# Patient Record
Sex: Male | Born: 1955 | Race: White | Hispanic: No | Marital: Married | Smoking: Current every day smoker
Health system: Southern US, Community
[De-identification: ages and names within clinical notes are randomized; demographics above are authoritative.]

## PROBLEM LIST (undated history)

## (undated) DIAGNOSIS — E785 Hyperlipidemia, unspecified: Secondary | ICD-10-CM

## (undated) DIAGNOSIS — I10 Essential (primary) hypertension: Secondary | ICD-10-CM

## (undated) HISTORY — DX: Essential (primary) hypertension: I10

## (undated) HISTORY — DX: Hyperlipidemia, unspecified: E78.5

---

## 2000-09-07 ENCOUNTER — Emergency Department (HOSPITAL_COMMUNITY): Admission: EM | Admit: 2000-09-07 | Discharge: 2000-09-07 | Payer: Self-pay | Admitting: Emergency Medicine

## 2000-09-07 ENCOUNTER — Encounter: Payer: Self-pay | Admitting: Emergency Medicine

## 2002-10-11 ENCOUNTER — Encounter: Payer: Self-pay | Admitting: Critical Care Medicine

## 2002-10-11 ENCOUNTER — Ambulatory Visit (HOSPITAL_COMMUNITY): Admission: RE | Admit: 2002-10-11 | Discharge: 2002-10-11 | Payer: Self-pay | Admitting: Critical Care Medicine

## 2002-10-22 ENCOUNTER — Encounter: Payer: Self-pay | Admitting: Critical Care Medicine

## 2002-10-22 ENCOUNTER — Ambulatory Visit (HOSPITAL_COMMUNITY): Admission: RE | Admit: 2002-10-22 | Discharge: 2002-10-22 | Payer: Self-pay | Admitting: Critical Care Medicine

## 2003-03-03 ENCOUNTER — Inpatient Hospital Stay (HOSPITAL_COMMUNITY): Admission: EM | Admit: 2003-03-03 | Discharge: 2003-03-05 | Payer: Self-pay | Admitting: Emergency Medicine

## 2011-09-16 DIAGNOSIS — M722 Plantar fascial fibromatosis: Secondary | ICD-10-CM | POA: Insufficient documentation

## 2012-08-03 DIAGNOSIS — R519 Headache, unspecified: Secondary | ICD-10-CM | POA: Insufficient documentation

## 2013-12-06 DIAGNOSIS — L209 Atopic dermatitis, unspecified: Secondary | ICD-10-CM | POA: Insufficient documentation

## 2013-12-06 DIAGNOSIS — L409 Psoriasis, unspecified: Secondary | ICD-10-CM | POA: Insufficient documentation

## 2014-06-25 DIAGNOSIS — R7309 Other abnormal glucose: Secondary | ICD-10-CM | POA: Insufficient documentation

## 2016-07-19 DIAGNOSIS — Z720 Tobacco use: Secondary | ICD-10-CM | POA: Insufficient documentation

## 2017-02-08 DIAGNOSIS — R0609 Other forms of dyspnea: Secondary | ICD-10-CM | POA: Insufficient documentation

## 2017-05-24 ENCOUNTER — Ambulatory Visit: Payer: Self-pay | Admitting: Osteopathic Medicine

## 2017-05-30 ENCOUNTER — Encounter: Payer: Self-pay | Admitting: Osteopathic Medicine

## 2017-05-30 ENCOUNTER — Ambulatory Visit: Payer: Managed Care, Other (non HMO) | Admitting: Osteopathic Medicine

## 2017-05-30 VITALS — BP 161/94 | HR 76 | Temp 98.1°F | Wt 212.0 lb

## 2017-05-30 DIAGNOSIS — Z720 Tobacco use: Secondary | ICD-10-CM | POA: Diagnosis not present

## 2017-05-30 DIAGNOSIS — I10 Essential (primary) hypertension: Secondary | ICD-10-CM | POA: Diagnosis not present

## 2017-05-30 DIAGNOSIS — Z23 Encounter for immunization: Secondary | ICD-10-CM | POA: Diagnosis not present

## 2017-05-30 DIAGNOSIS — E349 Endocrine disorder, unspecified: Secondary | ICD-10-CM | POA: Insufficient documentation

## 2017-05-30 MED ORDER — AMLODIPINE BESYLATE 10 MG PO TABS: 10.0000 mg | ORAL_TABLET | Freq: Every day | ORAL | 3 refills | Status: DC

## 2017-05-30 MED ORDER — HYDROCHLOROTHIAZIDE 25 MG PO TABS: 25.0000 mg | ORAL_TABLET | Freq: Every day | ORAL | 3 refills | Status: DC

## 2017-05-30 NOTE — Patient Instructions (Addendum)
   Will send refills of your medications today  Will plan to get labs ahead of next visit in 6 months - please call our office to make sure orders are in place   Any questions, let me know!    Plan to return for nurse visit to verify home blood pressure cuff. In the meantime, be keeping a record of you rblood pressures at home and bring this with you to the visit with the nurse.  If your cuff is measuring within 5-10 points of ours AND your home numbers are less than 140/90 (ideally less than 130/80) then nothing else to do.  If your home blood pressure cuff is inaccurate or is accurate but measuring above goal, we will need to talk about adjusting your medications.

## 2017-05-30 NOTE — Progress Notes (Signed)
HPI: James Alexander is a 62 y.o. male who  has a past medical history of High blood pressure.  he presents to [facility] today, 05/30/17,  for chief complaint of: Establish care Requests labs  HTN  New patient to est. Care, his daughter sees me. He works as a Administrator. Minimal health problems other than hypertension.  Pt has HTN and (+)FH MI in father. Reports his last Dr ordered an EKG which was suspicious for previous MI, sent for stress test d/t abn EKG 12/2016, the cardiologists told his at that point that he never had a heart attack - understandably, James Alexander was upset about the communication issues/confusion over whether or not he actually ever had heart attack. 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 Normal NM perfusion study by cardiology 02/2017. He has no issues with chest pain, trouble breathing, headache or vision change.  Labs reviewed: 12/2016 BMP ok, CBC was ok.  HepC screening engative and A1C was fine Lipids no concerns, last checked 07/2016 No elevated PSA's 2 low T values 2011      Past medical, surgical, social and family history reviewed:  Patient Active Problem List   Diagnosis Date Noted  . Hypertension 05/30/2017  . Hypotestosteronism 05/30/2017  . Tobacco use 07/19/2016    History reviewed. No pertinent surgical history.  Social History   Tobacco Use  . Smoking status: Current Every Day Smoker  . Smokeless tobacco: Never Used  . Tobacco comment: 30+ pack years, has cut back to 1 ppd  Substance Use Topics  . Alcohol use: Yes    Family History  Problem Relation Age of Onset  . Liver cancer Mother   . Heart attack Father   . High blood pressure Father      Current medication list and allergy/intolerance information reviewed:    Current Outpatient Medications  Medication Sig Dispense Refill  . amLODipine (NORVASC) 10 MG tablet  Take 1 tablet (10 mg total) by mouth daily. 90 tablet 3  . hydrochlorothiazide (HYDRODIURIL) 25 MG tablet Take 1 tablet (25 mg total) by mouth daily. 90 tablet 3   No current facility-administered medications for this visit.     Allergies  Allergen Reactions  . Penicillins Shortness Of Breath and Swelling      Review of Systems:  Constitutional:  No  fever, no chills, No recent illness, No unintentional weight changes. No significant fatigue.   HEENT: No  headache, no vision change, no hearing change, No sore throat, No  sinus pressure  Cardiac: No  chest pain, No  pressure, No palpitations, No  Orthopnea  Respiratory:  No  shortness of breath. No  Cough  Gastrointestinal: No  abdominal pain, No  nausea, No  vomiting,  No  blood in stool, No  diarrhea, No  constipation   Musculoskeletal: No new myalgia/arthralgia  Skin: No  Rash, No other wounds/concerning lesions  Genitourinary: No  incontinence, No  abnormal genital bleeding, No abnormal genital discharge  Hem/Onc: No  easy bruising/bleeding, No  abnormal lymph node  Endocrine: No cold intolerance,  No heat intolerance. No polyuria/polydipsia/polyphagia   Neurologic: No  weakness, No  dizziness, No  slurred speech/focal weakness/facial droop  Psychiatric: No  concerns with depression, No  concerns with anxiety, No sleep problems, No mood problems  Exam:  BP (!) 161/94   Pulse 76   Temp 98.1 F (36.7 C) (Oral)   Wt 212  lb 0.6 oz (96.2 kg)   Constitutional: VS see above. General Appearance: alert, well-developed, well-nourished, NAD  Eyes: Normal lids and conjunctive, non-icteric sclera  Ears, Nose, Mouth, Throat: MMM, Normal external inspection ears/nares/mouth/lips/gums. TM normal bilaterally. Pharynx/tonsils no erythema, no exudate. Nasal mucosa normal.   Neck: No masses, trachea midline. No thyroid enlargement. No tenderness/mass appreciated. No lymphadenopathy  Respiratory: Normal respiratory effort. no  wheeze, no rhonchi, no rales  Cardiovascular: S1/S2 normal, no murmur, no rub/gallop auscultated. RRR. No lower extremity edema. Pedal pulse II/IV bilaterally DP and PT. No carotid bruit or JVD. No abdominal aortic bruit.  Gastrointestinal: Nontender, no masses. No hepatomegaly, no splenomegaly. No hernia appreciated. Bowel sounds normal. Rectal exam deferred.   Musculoskeletal: Gait normal. No clubbing/cyanosis of digits.   Neurological: Normal balance/coordination. No tremor. No cranial nerve deficit on limited exam. Motor and sensation intact and symmetric. Cerebellar reflexes intact.   Skin: warm, dry, intact. No rash/ulcer. No concerning nevi or subq nodules on limited exam.    Psychiatric: Normal judgment/insight. Normal mood and affect. Oriented x3.      ASSESSMENT/PLAN:   Essential hypertension - Not at goal, patient states he would like to measure this at home, we need to verify home BP cuff. See patient instructions  Need for influenza vaccination - Plan: Flu Vaccine QUAD 6+ mos PF IM (Fluarix Quad PF)  Tobacco use    Patient Instructions   Will send refills of your medications today  Will plan to get labs ahead of next visit in 6 months - please call our office to make sure orders are in place   Any questions, let me know!    Plan to return for nurse visit to verify home blood pressure cuff. In the meantime, be keeping a record of you rblood pressures at home and bring this with you to the visit with the nurse.  If your cuff is measuring within 5-10 points of ours AND your home numbers are less than 140/90 (ideally less than 130/80) then nothing else to do.  If your home blood pressure cuff is inaccurate or is accurate but measuring above goal, we will need to talk about adjusting your medications.      Visit summary with medication list and pertinent instructions was printed for patient to review. All questions at time of visit were answered - patient instructed  to contact office with any additional concerns. ER/RTC precautions were reviewed with the patient.   Follow-up plan: Return in about 6 months (around 11/27/2017) for [facility] PHYSICAL and labs, blood pressure recheck . See me sooner if needed! .  Note: Total time spent 30 minutes, greater than 50% of the visit was spent face-to-face counseling and coordinating care for the following: The primary encounter diagnosis was Essential hypertension. Diagnoses of Need for influenza vaccination and Tobacco use were also pertinent to this visit.[name] Kitchen  Please note: voice recognition software was used to produce this document, and typos may escape review. Please contact Dr. Sheppard Coil for any needed clarifications.

## 2017-05-31 ENCOUNTER — Encounter: Payer: Self-pay | Admitting: Osteopathic Medicine

## 2017-09-14 ENCOUNTER — Telehealth: Payer: Self-pay

## 2017-09-14 DIAGNOSIS — Z0000 Encounter for general adult medical examination without abnormal findings: Secondary | ICD-10-CM

## 2017-09-14 NOTE — Telephone Encounter (Signed)
Pt has a physical scheduled for 11-30-17. Pt wanted to request that his labs be ordered so that he can come in a couple days prior to have them drawn.   Msg to Dr Sheppard Coil.   Pt states he does not need call back unless there is an issue ordering labs.   Thanks!

## 2017-09-18 NOTE — Telephone Encounter (Signed)
Noted  

## 2017-09-18 NOTE — Telephone Encounter (Signed)
Orders in, he should be able to come fasting to the lab at his convenience.

## 2017-11-28 ENCOUNTER — Encounter: Payer: Managed Care, Other (non HMO) | Admitting: Osteopathic Medicine

## 2017-11-30 ENCOUNTER — Encounter: Payer: Self-pay | Admitting: Osteopathic Medicine

## 2017-11-30 ENCOUNTER — Ambulatory Visit (INDEPENDENT_AMBULATORY_CARE_PROVIDER_SITE_OTHER): Payer: Managed Care, Other (non HMO) | Admitting: Osteopathic Medicine

## 2017-11-30 VITALS — BP 127/71 | HR 73 | Temp 98.1°F | Wt 206.0 lb

## 2017-11-30 DIAGNOSIS — Z0000 Encounter for general adult medical examination without abnormal findings: Secondary | ICD-10-CM | POA: Diagnosis not present

## 2017-11-30 LAB — LIPID PANEL
Cholesterol: 197 mg/dL (ref <200)
HDL: 56 mg/dL (ref >40)
LDL Cholesterol (Calc): 116 mg/dL (calc) — ABNORMAL HIGH
NON-HDL CHOLESTEROL (CALC): 141 mg/dL — AB (ref <130)
Total CHOL/HDL Ratio: 3.5 (calc) (ref <5.0)
Triglycerides: 140 mg/dL (ref <150)

## 2017-11-30 LAB — COMPLETE METABOLIC PANEL WITH GFR
AG RATIO: 1.5 (calc) (ref 1.0–2.5)
ALT: 23 U/L (ref 9–46)
AST: 17 U/L (ref 10–35)
Albumin: 4.4 g/dL (ref 3.6–5.1)
Alkaline phosphatase (APISO): 85 U/L (ref 40–115)
BILIRUBIN TOTAL: 0.6 mg/dL (ref 0.2–1.2)
BUN: 12 mg/dL (ref 7–25)
CALCIUM: 9.7 mg/dL (ref 8.6–10.3)
CHLORIDE: 104 mmol/L (ref 98–110)
CO2: 23 mmol/L (ref 20–32)
Creat: 1.01 mg/dL (ref 0.70–1.25)
GFR, EST AFRICAN AMERICAN: 93 mL/min/{1.73_m2} (ref >=60)
GFR, EST NON AFRICAN AMERICAN: 80 mL/min/{1.73_m2} (ref >=60)
Globulin: 2.9 g/dL (calc) (ref 1.9–3.7)
Glucose, Bld: 96 mg/dL (ref 65–139)
POTASSIUM: 4.6 mmol/L (ref 3.5–5.3)
Sodium: 138 mmol/L (ref 135–146)
TOTAL PROTEIN: 7.3 g/dL (ref 6.1–8.1)

## 2017-11-30 LAB — CBC
HCT: 53 % — ABNORMAL HIGH (ref 38.5–50.0)
Hemoglobin: 17.7 g/dL — ABNORMAL HIGH (ref 13.2–17.1)
MCH: 27.7 pg (ref 27.0–33.0)
MCHC: 33.4 g/dL (ref 32.0–36.0)
MCV: 82.8 fL (ref 80.0–100.0)
MPV: 10.3 fL (ref 7.5–12.5)
Platelets: 201 10*3/uL (ref 140–400)
RBC: 6.4 10*6/uL — ABNORMAL HIGH (ref 4.20–5.80)
RDW: 13.7 % (ref 11.0–15.0)
WBC: 9 10*3/uL (ref 3.8–10.8)

## 2017-11-30 LAB — HIV ANTIBODY (ROUTINE TESTING W REFLEX): HIV 1&2 Ab, 4th Generation: NONREACTIVE

## 2017-11-30 LAB — PSA, TOTAL WITH REFLEX TO PSA, FREE: PSA, TOTAL: 0.4 ng/mL (ref <=4.0)

## 2017-11-30 NOTE — Patient Instructions (Addendum)
General Preventive Care  Most recent routine screening lipids/other labs: ordered today  Tobacco: please quit! Let me know if you need help.   Alcohol: moderation is ok for most people.   Recreational/Illicit Drugs: don't!  Exercise: as tolerated to reduce risk of cardiovascular disease and diabetes  Mental health: if need for mental health care (medicines, counseling, other), or concerns about moods, please let me know!   Sexual health: if need for STD testing, or if problems with libido/pain, please let me know!   Vaccines  Flu vaccine: recommended every fall (by Halloween!)  Shingles vaccine: Shingrix recommended after age 49, many adults have already had Zostavax (older vaccine)  Pneumonia vaccines: Prevnar and Pneumovax recommended after age 42, sooner if diabetes, COPD/asthma, smokers, others  Tetanus booster: Tdap recommended every 10 years  Cancer screenings   Colon cancer screening: recommended at age 74  Prostate cancer screening: recommendations vary, optional PSA blood test for men age 79-70  Will think about lung cancer screening with annual CT scan   Infection screenings . HIV: recommended screening at least once age 71-65 . Gonorrhea/Chlamydia: screening as needed . Hepatitis C: recommended for anyone born 70-1964   Other . Bone Density Test: recommended for men at age 89 . Advanced Directive: Living Will and/or Healthcare Power of Attorney recommended for everyone over age 56, regardless of health . Cholesterol & Diabetes: recommended screening annually . Thyroid and Vitamin D: routine screening not recommended, most insurance will not cover this test

## 2017-11-30 NOTE — Progress Notes (Signed)
HPI: James Alexander is a 62 y.o. male who  has a past medical history of High blood pressure.  he presents to [facility] today, 11/30/17,  for chief complaint of: Annual Physical     Patient here for annual physical / wellness exam.  See preventive care reviewed as below.  Recent labs reviewed in detail with the patient.   Additional concerns today include:  None! Doing well  Still smoking - declined pneumovax, will get flu shot later in the fall, declined CT chest  Uncertain when last colonoscopy was, pretty sure was at [facility] in [place].      Past medical, surgical, social and family history reviewed:  Patient Active Problem List   Diagnosis Date Noted  . Hypertension 05/30/2017  . Hypotestosteronism 05/30/2017  . Tobacco use 07/19/2016    No past surgical history on file.  Social History   Tobacco Use  . Smoking status: Current Every Day Smoker  . Smokeless tobacco: Never Used  . Tobacco comment: 30+ pack years, has cut back to 1 ppd  Substance Use Topics  . Alcohol use: Yes    Family History  Problem Relation Age of Onset  . Liver cancer Mother   . Heart attack Father   . High blood pressure Father     Immunization History  Administered Date(s) Administered  . Influenza,inj,Quad PF,6+ Mos 05/30/2017    Current medication list and allergy/intolerance information reviewed:    Current Outpatient Medications  Medication Sig Dispense Refill  . amLODipine (NORVASC) 10 MG tablet Take 1 tablet (10 mg total) by mouth daily. 90 tablet 3  . hydrochlorothiazide (HYDRODIURIL) 25 MG tablet Take 1 tablet (25 mg total) by mouth daily. 90 tablet 3   No current facility-administered medications for this visit.     Allergies  Allergen Reactions  . Penicillins Shortness Of Breath and Swelling      Review of Systems:  Constitutional:  No  fever, no chills, No recent illness, No unintentional weight changes.  No significant fatigue.   HEENT: No  headache, no vision change, no hearing change, No sore throat, No  sinus pressure  Cardiac: No  chest pain, No  pressure, No palpitations, No  Orthopnea  Respiratory:  No  shortness of breath. No  Cough  Gastrointestinal: No  abdominal pain, No  nausea, No  vomiting,  No  blood in stool, No  diarrhea, No  constipation   Musculoskeletal: No new myalgia/arthralgia  Skin: No  Rash, No other wounds/concerning lesions  Genitourinary: No  incontinence, No  abnormal genital bleeding, No abnormal genital discharge  Hem/Onc: No  easy bruising/bleeding, No  abnormal lymph node  Endocrine: No cold intolerance,  No heat intolerance. No polyuria/polydipsia/polyphagia   Neurologic: No  weakness, No  dizziness, No  slurred speech/focal weakness/facial droop  Psychiatric: No  concerns with depression, No  concerns with anxiety, No sleep problems, No mood problems  Exam:  BP 127/71 (BP Location: Left Arm, Patient Position: Sitting, Cuff Size: Normal)   Pulse 73   Temp 98.1 F (36.7 C) (Oral)   Wt 206 lb (93.4 kg)   Constitutional: VS see above. General Appearance: alert, well-developed, well-nourished, NAD  Eyes: Normal lids and conjunctive, non-icteric sclera  Ears, Nose, Mouth, Throat: MMM, Normal external inspection ears/nares/mouth/lips/gums. TM normal bilaterally. Pharynx/tonsils no erythema, no exudate. Nasal mucosa normal.   Neck: No masses, trachea midline. No thyroid enlargement. No tenderness/mass appreciated. No lymphadenopathy  Respiratory: Normal respiratory effort.  no wheeze, no rhonchi, no rales  Cardiovascular: S1/S2 normal, no murmur, no rub/gallop auscultated. RRR. No lower extremity edema. Pedal pulse II/IV bilaterally DP and PT. No carotid bruit or JVD. No abdominal aortic bruit.  Gastrointestinal: Nontender, no masses. No hepatomegaly, no splenomegaly. No hernia appreciated. Bowel sounds normal. Rectal exam deferred.    Musculoskeletal: Gait normal. No clubbing/cyanosis of digits.   Neurological: Normal balance/coordination. No tremor. No cranial nerve deficit on limited exam. Motor and sensation intact and symmetric. Cerebellar reflexes intact.   Skin: warm, dry, intact. No rash/ulcer. No concerning nevi or subq nodules on limited exam.    Psychiatric: Normal judgment/insight. Normal mood and affect. Oriented x3.     ASSESSMENT/PLAN:   Annual physical exam    Patient Instructions  General Preventive Care  Most recent routine screening lipids/other labs: ordered today  Tobacco: please quit! Let me know if you need help.   Alcohol: moderation is ok for most people.   Recreational/Illicit Drugs: don't!  Exercise: as tolerated to reduce risk of cardiovascular disease and diabetes  Mental health: if need for mental health care (medicines, counseling, other), or concerns about moods, please let me know!   Sexual health: if need for STD testing, or if problems with libido/pain, please let me know!   Vaccines  Flu vaccine: recommended every fall (by Halloween!)  Shingles vaccine: Shingrix recommended after age 15, many adults have already had Zostavax (older vaccine)  Pneumonia vaccines: Prevnar and Pneumovax recommended after age 27, sooner if diabetes, COPD/asthma, smokers, others  Tetanus booster: Tdap recommended every 10 years  Cancer screenings   Colon cancer screening: recommended at age 61  Prostate cancer screening: recommendations vary, optional PSA blood test for men age 102-70  Will think about lung cancer screening with annual CT scan   Infection screenings . HIV: recommended screening at least once age 71-65 . Gonorrhea/Chlamydia: screening as needed . Hepatitis C: recommended for anyone born 04-1962   Other . Bone Density Test: recommended for men at age 24 . Advanced Directive: Living Will and/or Healthcare Power of Attorney recommended for everyone over age 55,  regardless of health . Cholesterol & Diabetes: recommended screening annually . Thyroid and Vitamin D: routine screening not recommended, most insurance will not cover this test     Visit summary with medication list and pertinent instructions was printed for patient to review. All questions at time of visit were answered - patient instructed to contact office with any additional concerns. ER/RTC precautions were reviewed with the patient.   Follow-up plan: Return in about 6 months (around 06/02/2018) for recheck BP and labs if needed .    Please note: voice recognition software was used to produce this document, and typos may escape review. Please contact Dr. Sheppard Coil for any needed clarifications.

## 2017-12-05 ENCOUNTER — Telehealth: Payer: Self-pay | Admitting: Osteopathic Medicine

## 2017-12-05 NOTE — Telephone Encounter (Signed)
Added

## 2017-12-05 NOTE — Telephone Encounter (Signed)
-----   Message from Emeterio Reeve, DO sent at 11/30/2017  7:47 AM EDT ----- Regarding: shinrix shingrix vaccine please!

## 2018-02-28 ENCOUNTER — Ambulatory Visit (INDEPENDENT_AMBULATORY_CARE_PROVIDER_SITE_OTHER): Payer: Managed Care, Other (non HMO) | Admitting: Osteopathic Medicine

## 2018-02-28 VITALS — BP 134/84 | HR 74

## 2018-02-28 DIAGNOSIS — Z23 Encounter for immunization: Secondary | ICD-10-CM

## 2018-02-28 NOTE — Progress Notes (Signed)
Pt in today for shingrix vaccine. This is1 of 2 of the shingrix series. Vitals taken and no fever noted. Vaccine was given in left deltoid. Pt tolerated well with no immediate complications. Pt advised to return in 2 months for 2nd shingrix, pt stated he would get at his February appt.

## 2018-05-23 ENCOUNTER — Encounter: Payer: Self-pay | Admitting: Osteopathic Medicine

## 2018-05-23 ENCOUNTER — Ambulatory Visit: Payer: Managed Care, Other (non HMO) | Admitting: Osteopathic Medicine

## 2018-05-23 VITALS — BP 147/85 | HR 80 | Temp 98.2°F | Wt 216.0 lb

## 2018-05-23 DIAGNOSIS — Z716 Tobacco abuse counseling: Secondary | ICD-10-CM | POA: Diagnosis not present

## 2018-05-23 DIAGNOSIS — Z713 Dietary counseling and surveillance: Secondary | ICD-10-CM

## 2018-05-23 DIAGNOSIS — Z23 Encounter for immunization: Secondary | ICD-10-CM | POA: Diagnosis not present

## 2018-05-23 DIAGNOSIS — Z720 Tobacco use: Secondary | ICD-10-CM

## 2018-05-23 DIAGNOSIS — I10 Essential (primary) hypertension: Secondary | ICD-10-CM | POA: Diagnosis not present

## 2018-05-23 DIAGNOSIS — J302 Other seasonal allergic rhinitis: Secondary | ICD-10-CM | POA: Diagnosis not present

## 2018-05-23 MED ORDER — HYDROCHLOROTHIAZIDE 25 MG PO TABS: 25.0000 mg | ORAL_TABLET | Freq: Every day | ORAL | 3 refills | Status: DC

## 2018-05-23 MED ORDER — CETIRIZINE HCL 10 MG PO TABS: 10.0000 mg | ORAL_TABLET | Freq: Every day | ORAL | 1 refills | Status: DC

## 2018-05-23 MED ORDER — AMLODIPINE BESYLATE 10 MG PO TABS: 10.0000 mg | ORAL_TABLET | Freq: Every day | ORAL | 3 refills | Status: DC

## 2018-05-23 NOTE — Progress Notes (Signed)
HPI: James Alexander is a 63 y.o. male who  has a past medical history of High blood pressure.  he presents to [facility] today, 05/23/18,  for chief complaint of:  HTN follow-up Diet questions Allergies   HTN: BP above goal. Still smoking, not watching diet. No CP/SOB.   Today: 147/85 BP Readings from Last 3 Encounters:  05/23/18 (!) 147/85  02/28/18 134/84  11/30/17 127/71    He and wife have questions about diet changes. Would like to lower salt, reduce weight.   He'd like something for allergies, previously on a pill for a short time which was helping, can't recall name.   Smoker: chantix caused rash in the past. Patches/gums not helpful.        At today's visit 05/23/18 ... PMH, PSH, FH reviewed and updated as needed.  Current medication list and allergy/intolerance hx reviewed and updated as needed. (See remainder of HPI, ROS, Phys Exam below)           ASSESSMENT/PLAN: The primary encounter diagnosis was Essential hypertension. Diagnoses of Need for varicella vaccine, Tobacco use, Seasonal allergies, Dietary counseling, and Encounter for tobacco use cessation counseling were also pertinent to this visit.   Orders Placed This Encounter  Procedures  . Varicella-zoster vaccine IM (Shingrix)     Meds ordered this encounter  Medications  . amLODipine (NORVASC) 10 MG tablet    Sig: Take 1 tablet (10 mg total) by mouth daily.    Dispense:  90 tablet    Refill:  3  . hydrochlorothiazide (HYDRODIURIL) 25 MG tablet    Sig: Take 1 tablet (25 mg total) by mouth daily.    Dispense:  90 tablet    Refill:  3  . cetirizine (ZYRTEC) 10 MG tablet    Sig: Take 1 tablet (10 mg total) by mouth daily.    Dispense:  90 tablet    Refill:  1    Patient Instructions  Blood pressure: Plan to return for nurse visit to verify home blood pressure cuff. In the meantime, be keeping a record of your blood pressures at home and bring this  with you to the visit with the nurse.   If your cuff is measuring within 5-10 points of ours AND your home numbers are less than 130/80, then nothing else to do.   If your home blood pressure cuff is inaccurate or is accurate but measuring above goal, we will need to talk about adjusting your medications.   Even fairly severe allergies can typically be treated successfully with over-the-counter medications. I usually will recommend a combination of: . an antihistamine such as Allegra, Zyrtec, or Claritin or one of their generic equivalents. (I sent Rx for Claritin)  . Can add steroid nasal spray such as Flonase or Nasonex or one of their generic equivalents, PLUS... . Can also add a decongestant, either Sudafed on its own OR any of the antihistamines with the "-D" in the name that he has to show your ID to the pharmacist to buy.  Call me if the above isn't helping!    Additional information printed on DASH diet, Mediterranean diet, quitting smoking.   Follow-up plan: Return for nurse visit check BP cuff in 1-2 weeks. Otherwise annual physical w/ labs in 6 mos. .                                                 ################################################# ################################################# ################################################# #################################################  Current Meds  Medication Sig  . amLODipine (NORVASC) 10 MG tablet Take 1 tablet (10 mg total) by mouth daily.  . hydrochlorothiazide (HYDRODIURIL) 25 MG tablet Take 1 tablet (25 mg total) by mouth daily.  . [DISCONTINUED] amLODipine (NORVASC) 10 MG tablet Take 1 tablet (10 mg total) by mouth daily.  . [DISCONTINUED] hydrochlorothiazide (HYDRODIURIL) 25 MG tablet Take 1 tablet (25 mg total) by mouth daily.    Allergies  Allergen Reactions  . Penicillins Shortness Of Breath and Swelling       Review of  Systems:  Constitutional: No recent illness  HEENT: No  headache, no vision change  Cardiac: No  chest pain, No  pressure, No palpitations  Respiratory:  No  shortness of breath. No  Cough  Neurologic: No  weakness, No  Dizziness  Psychiatric: No  concerns with depression, No  concerns with anxiety  Exam:  BP (!) 147/85 (BP Location: Left Arm, Patient Position: Sitting, Cuff Size: Normal)   Pulse 80   Temp 98.2 F (36.8 C) (Oral)   Wt 216 lb (98 kg)   Constitutional: VS see above. General Appearance: alert, well-developed, well-nourished, NAD  Eyes: Normal lids and conjunctive, non-icteric sclera  Ears, Nose, Mouth, Throat: MMM, Normal external inspection ears/nares/mouth/lips/gums.  Neck: No masses, trachea midline.   Respiratory: Normal respiratory effort. no wheeze, no rhonchi, no rales  Cardiovascular: S1/S2 normal, no murmur, no rub/gallop auscultated. RRR.   Musculoskeletal: Gait normal. Symmetric and independent movement of all extremities  Neurological: Normal balance/coordination. No tremor.  Skin: warm, dry, intact.   Psychiatric: Normal judgment/insight. Normal mood and affect. Oriented x3.       Visit summary with medication list and pertinent instructions was printed for patient to review, patient was advised to alert [place] if any updates are needed. All questions at time of visit were answered - patient instructed to contact office with any additional concerns. ER/RTC precautions were reviewed with the patient and understanding verbalized.   Note: Total time spent 25 minutes, greater than 50% of the visit was spent face-to-face counseling and coordinating care for the following: The primary encounter diagnosis was Essential hypertension. Diagnoses of Need for varicella vaccine, Tobacco use, Seasonal allergies, Dietary counseling, and Encounter for tobacco use cessation counseling were also pertinent to this visit.[name] Kitchen  Please note: voice recognition software  was used to produce this document, and typos may escape review. Please contact Dr. Sheppard Coil for any needed clarifications.    Follow up plan: Return for nurse visit check BP cuff in 1-2 weeks. Otherwise annual physical w/ labs in 6 mos. [name] Kitchen

## 2018-05-23 NOTE — Patient Instructions (Signed)
Blood pressure: Plan to return for nurse visit to verify home blood pressure cuff. In the meantime, be keeping a record of your blood pressures at home and bring this with you to the visit with the nurse.   If your cuff is measuring within 5-10 points of ours AND your home numbers are less than 130/80, then nothing else to do.   If your home blood pressure cuff is inaccurate or is accurate but measuring above goal, we will need to talk about adjusting your medications.   Even fairly severe allergies can typically be treated successfully with over-the-counter medications. I usually will recommend a combination of: . an antihistamine such as Allegra, Zyrtec, or Claritin or one of their generic equivalents. (I sent Rx for Claritin)  . Can add steroid nasal spray such as Flonase or Nasonex or one of their generic equivalents, PLUS... . Can also add a decongestant, either Sudafed on its own OR any of the antihistamines with the "-D" in the name that he has to show your ID to the pharmacist to buy.  Call me if the above isn't helping!     DASH Eating Plan DASH stands for "Dietary Approaches to Stop Hypertension." The DASH eating plan is a healthy eating plan that has been shown to reduce high blood pressure (hypertension). It may also reduce your risk for type 2 diabetes, heart disease, and stroke. The DASH eating plan may also help with weight loss. What are tips for following this plan?  General guidelines  Avoid eating more than 2,300 mg (milligrams) of salt (sodium) a day. If you have hypertension, you may need to reduce your sodium intake to 1,500 mg a day.  Limit alcohol intake to no more than 1 drink a day for nonpregnant women and 2 drinks a day for men. One drink equals 12 oz of beer, 5 oz of wine, or 1 oz of hard liquor.  Work with your health care provider to maintain a healthy body weight or to lose weight. Ask what an ideal weight is for you.  Get at least 30 minutes of exercise  that causes your heart to beat faster (aerobic exercise) most days of the week. Activities may include walking, swimming, or biking.  Work with your health care provider or diet and nutrition specialist (dietitian) to adjust your eating plan to your individual calorie needs. Reading food labels   Check food labels for the amount of sodium per serving. Choose foods with less than 5 percent of the Daily Value of sodium. Generally, foods with less than 300 mg of sodium per serving fit into this eating plan.  To find whole grains, look for the word "whole" as the first word in the ingredient list. Shopping  Buy products labeled as "low-sodium" or "no salt added."  Buy fresh foods. Avoid canned foods and premade or frozen meals. Cooking  Avoid adding salt when cooking. Use salt-free seasonings or herbs instead of table salt or sea salt. Check with your health care provider or pharmacist before using salt substitutes.  Do not fry foods. Cook foods using healthy methods such as baking, boiling, grilling, and broiling instead.  Cook with heart-healthy oils, such as olive, canola, soybean, or sunflower oil. Meal planning  Eat a balanced diet that includes: ? 5 or more servings of fruits and vegetables each day. At each meal, try to fill half of your plate with fruits and vegetables. ? Up to 6-8 servings of whole grains each day. ? Less than  6 oz of lean meat, poultry, or fish each day. A 3-oz serving of meat is about the same size as a deck of cards. One egg equals 1 oz. ? 2 servings of low-fat dairy each day. ? A serving of nuts, seeds, or beans 5 times each week. ? Heart-healthy fats. Healthy fats called Omega-3 fatty acids are found in foods such as flaxseeds and coldwater fish, like sardines, salmon, and mackerel.  Limit how much you eat of the following: ? Canned or prepackaged foods. ? Food that is high in trans fat, such as fried foods. ? Food that is high in saturated fat, such as  fatty meat. ? Sweets, desserts, sugary drinks, and other foods with added sugar. ? Full-fat dairy products.  Do not salt foods before eating.  Try to eat at least 2 vegetarian meals each week.  Eat more home-cooked food and less restaurant, buffet, and fast food.  When eating at a restaurant, ask that your food be prepared with less salt or no salt, if possible. What foods are recommended? The items listed may not be a complete list. Talk with your dietitian about what dietary choices are best for you. Grains Whole-grain or whole-wheat bread. Whole-grain or whole-wheat pasta. Brown rice. [place]. Bulgur. Whole-grain and low-sodium cereals. Pita bread. Low-fat, low-sodium crackers. Whole-wheat flour tortillas. Vegetables Fresh or frozen vegetables (raw, steamed, roasted, or grilled). Low-sodium or reduced-sodium tomato and vegetable juice. Low-sodium or reduced-sodium tomato sauce and tomato paste. Low-sodium or reduced-sodium canned vegetables. Fruits All fresh, dried, or frozen fruit. Canned fruit in natural juice (without added sugar). Meat and other protein foods Skinless chicken or [place]. Ground chicken or [place]. Pork with fat trimmed off. Fish and seafood. Egg whites. Dried beans, peas, or lentils. Unsalted nuts, nut butters, and seeds. Unsalted canned beans. Lean cuts of beef with fat trimmed off. Low-sodium, lean deli meat. Dairy Low-fat (1%) or fat-free (skim) milk. Fat-free, low-fat, or reduced-fat cheeses. Nonfat, low-sodium ricotta or cottage cheese. Low-fat or nonfat yogurt. Low-fat, low-sodium cheese. Fats and oils Soft margarine without trans fats. Vegetable oil. Low-fat, reduced-fat, or light mayonnaise and salad dressings (reduced-sodium). Canola, safflower, olive, soybean, and sunflower oils. Avocado. Seasoning and other foods Herbs. Spices. Seasoning mixes without salt. Unsalted popcorn and pretzels. Fat-free sweets. What foods are not recommended? The items  listed may not be a complete list. Talk with your dietitian about what dietary choices are best for you. Grains Baked goods made with fat, such as croissants, muffins, or some breads. Dry pasta or rice meal packs. Vegetables Creamed or fried vegetables. Vegetables in a cheese sauce. Regular canned vegetables (not low-sodium or reduced-sodium). Regular canned tomato sauce and paste (not low-sodium or reduced-sodium). Regular tomato and vegetable juice (not low-sodium or reduced-sodium). Angie Fava. Olives. Fruits Canned fruit in a light or heavy syrup. Fried fruit. Fruit in cream or butter sauce. Meat and other protein foods Fatty cuts of meat. Ribs. Fried meat. Berniece Salines. Sausage. Bologna and other processed lunch meats. Salami. Fatback. Hotdogs. Bratwurst. Salted nuts and seeds. Canned beans with added salt. Canned or smoked fish. Whole eggs or egg yolks. Chicken or [place] with skin. Dairy Whole or 2% milk, cream, and half-and-half. Whole or full-fat cream cheese. Whole-fat or sweetened yogurt. Full-fat cheese. Nondairy creamers. Whipped toppings. Processed cheese and cheese spreads. Fats and oils Butter. Stick margarine. Lard. Shortening. Ghee. Bacon fat. Tropical oils, such as coconut, palm kernel, or palm oil. Seasoning and other foods Salted popcorn and pretzels. Onion salt, garlic salt,  seasoned salt, table salt, and sea salt. Worcestershire sauce. Tartar sauce. Barbecue sauce. Teriyaki sauce. Soy sauce, including reduced-sodium. Steak sauce. Canned and packaged gravies. Fish sauce. Oyster sauce. Cocktail sauce. Horseradish that you find on the shelf. Ketchup. Mustard. Meat flavorings and tenderizers. Bouillon cubes. Hot sauce and Tabasco sauce. Premade or packaged marinades. Premade or packaged taco seasonings. Relishes. Regular salad dressings. Where to find more information:  National Heart, Lung, and [place]: https://wilson-eaton.com/  American Heart Association: www.heart.org Summary  The  DASH eating plan is a healthy eating plan that has been shown to reduce high blood pressure (hypertension). It may also reduce your risk for type 2 diabetes, heart disease, and stroke.  With the DASH eating plan, you should limit salt (sodium) intake to 2,300 mg a day. If you have hypertension, you may need to reduce your sodium intake to 1,500 mg a day.  When on the DASH eating plan, aim to eat more fresh fruits and vegetables, whole grains, lean proteins, low-fat dairy, and heart-healthy fats.  Work with your health care provider or diet and nutrition specialist (dietitian) to adjust your eating plan to your individual calorie needs. This information is not intended to replace advice given to you by your health care provider. Make sure you discuss any questions you have with your health care provider. Document Released: 03/17/2011 Document Revised: 03/21/2016 Document Reviewed: 03/21/2016 Elsevier Interactive Patient Education  2019 [place] refers to food and lifestyle choices that are based on the traditions of countries located on the [employer]. This way of eating has been shown to help prevent certain conditions and improve outcomes for people who have chronic diseases, like kidney disease and heart disease. What are tips for following this plan? Lifestyle  Cook and eat meals together with your family, when possible.  Drink enough fluid to keep your urine clear or pale yellow.  Be physically active every day. This includes: ? Aerobic exercise like running or swimming. ? Leisure activities like gardening, walking, or housework.  Get 7-8 hours of sleep each night.  If recommended by your health care provider, drink red wine in moderation. This means 1 glass a day for nonpregnant women and 2 glasses a day for men. A glass of wine equals 5 oz (150 mL). Reading food labels   Check the serving size of packaged foods. For foods  such as rice and pasta, the serving size refers to the amount of cooked product, not dry.  Check the total fat in packaged foods. Avoid foods that have saturated fat or trans fats.  Check the ingredients list for added sugars, such as corn syrup. Shopping  At the grocery store, buy most of your food from the areas near the walls of the store. This includes: ? Fresh fruits and vegetables (produce). ? Grains, beans, nuts, and seeds. Some of these may be available in unpackaged forms or large amounts (in bulk). ? Fresh seafood. ? Poultry and eggs. ? Low-fat dairy products.  Buy whole ingredients instead of prepackaged foods.  Buy fresh fruits and vegetables in-season from local farmers markets.  Buy frozen fruits and vegetables in resealable bags.  If you do not have access to quality fresh seafood, buy precooked frozen shrimp or canned fish, such as tuna, salmon, or sardines.  Buy small amounts of raw or cooked vegetables, salads, or olives from the deli or salad bar at your store.  Stock your pantry so you always  have certain foods on hand, such as olive oil, canned tuna, canned tomatoes, rice, pasta, and beans. Cooking  Cook foods with extra-virgin olive oil instead of using butter or other vegetable oils.  Have meat as a side dish, and have vegetables or grains as your main dish. This means having meat in small portions or adding small amounts of meat to foods like pasta or stew.  Use beans or vegetables instead of meat in common dishes like chili or lasagna.  Experiment with different cooking methods. Try roasting or broiling vegetables instead of steaming or sauteing them.  Add frozen vegetables to soups, stews, pasta, or rice.  Add nuts or seeds for added healthy fat at each meal. You can add these to yogurt, salads, or vegetable dishes.  Marinate fish or vegetables using olive oil, lemon juice, garlic, and fresh herbs. Meal planning   Plan to eat 1 vegetarian meal one  day each week. Try to work up to 2 vegetarian meals, if possible.  Eat seafood 2 or more times a week.  Have healthy snacks readily available, such as: ? Vegetable sticks with hummus. ? [place] yogurt. ? Fruit and nut trail mix.  Eat balanced meals throughout the week. This includes: ? Fruit: 2-3 servings a day ? Vegetables: 4-5 servings a day ? Low-fat dairy: 2 servings a day ? Fish, poultry, or lean meat: 1 serving a day ? Beans and legumes: 2 or more servings a week ? Nuts and seeds: 1-2 servings a day ? Whole grains: 6-8 servings a day ? Extra-virgin olive oil: 3-4 servings a day  Limit red meat and sweets to only a few servings a month What are my food choices?  Mediterranean diet ? Recommended ? Grains: Whole-grain pasta. Brown rice. Bulgar wheat. Polenta. Couscous. Whole-wheat bread. [place]. ? Vegetables: Artichokes. Beets. Broccoli. Cabbage. Carrots. Eggplant. Green beans. Chard. Kale. Spinach. Onions. Leeks. Peas. Squash. Tomatoes. Peppers. Radishes. ? Fruits: Apples. Apricots. Avocado. Berries. Bananas. Cherries. Dates. Figs. Grapes. Lemons. Melon. Oranges. Peaches. Plums. Pomegranate. ? Meats and other protein foods: Beans. Almonds. Sunflower seeds. Pine nuts. Peanuts. [place]. Salmon. Scallops. Shrimp. [place]. Tilapia. Clams. Oysters. Eggs. ? Dairy: Low-fat milk. Cheese. Greek yogurt. ? Beverages: Water. Red wine. Herbal tea. ? Fats and oils: Extra virgin olive oil. Avocado oil. Grape seed oil. ? Sweets and desserts: [place] yogurt with honey. Baked apples. Poached pears. Trail mix. ? Seasoning and other foods: Basil. Cilantro. Coriander. Cumin. Mint. Parsley. Sage. Rosemary. Tarragon. Garlic. Oregano. Thyme. Pepper. Balsalmic vinegar. Tahini. Hummus. Tomato sauce. Olives. Mushrooms. ? Limit these ? Grains: Prepackaged pasta or rice dishes. Prepackaged cereal with added sugar. ? Vegetables: Deep fried potatoes (french fries). ? Fruits: Fruit canned in syrup. ? Meats  and other protein foods: Beef. Pork. Lamb. Poultry with skin. Hot dogs. Berniece Salines. ? Dairy: Ice cream. Sour cream. Whole milk. ? Beverages: Juice. Sugar-sweetened soft drinks. Beer. Liquor and spirits. ? Fats and oils: Butter. Canola oil. Vegetable oil. Beef fat (tallow). Lard. ? Sweets and desserts: Cookies. Cakes. Pies. Candy. ? Seasoning and other foods: Mayonnaise. Premade sauces and marinades. ? The items listed may not be a complete list. Talk with your dietitian about what dietary choices are right for you. Summary  The Mediterranean diet includes both food and lifestyle choices.  Eat a variety of fresh fruits and vegetables, beans, nuts, seeds, and whole grains.  Limit the amount of red meat and sweets that you eat.  Talk with your health care provider about whether it is  safe for you to drink red wine in moderation. This means 1 glass a day for nonpregnant women and 2 glasses a day for men. A glass of wine equals 5 oz (150 mL). This information is not intended to replace advice given to you by your health care provider. Make sure you discuss any questions you have with your health care provider. Document Released: 11/19/2015 Document Revised: 12/22/2015 Document Reviewed: 11/19/2015 Elsevier Interactive Patient Education  2019 [employer].      Steps to Quit Smoking  Smoking tobacco can be harmful to your health and can affect almost every organ in your body. Smoking puts you, and those around you, at risk for developing many serious chronic diseases. Quitting smoking is difficult, but it is one of the best things that you can do for your health. It is never too late to quit. What are the benefits of quitting smoking? When you quit smoking, you lower your risk of developing serious diseases and conditions, such as:  Lung cancer or lung disease, such as COPD.  Heart disease.  Stroke.  Heart attack.  Infertility.  Osteoporosis and bone fractures. Additionally, symptoms  such as coughing, wheezing, and shortness of breath may get better when you quit. You may also find that you get sick less often because your body is stronger at fighting off colds and infections. If you are pregnant, quitting smoking can help to reduce your chances of having a baby of low birth weight. How do I get ready to quit? When you decide to quit smoking, create a plan to make sure that you are successful. Before you quit:  Pick a date to quit. Set a date within the next two weeks to give you time to prepare.  Write down the reasons why you are quitting. Keep this list in places where you will see it often, such as on your bathroom mirror or in your car or wallet.  Identify the people, places, things, and activities that make you want to smoke (triggers) and avoid them. Make sure to take these actions: ? Throw away all cigarettes at home, at work, and in your car. ? Throw away smoking accessories, such as Scientist, research (medical). ? Clean your car and make sure to empty the ashtray. ? Clean your home, including curtains and carpets.  Tell your family, friends, and coworkers that you are quitting. Support from your loved ones can make quitting easier.  Talk with your health care provider about your options for quitting smoking.  Find out what treatment options are covered by your health insurance. What strategies can I use to quit smoking? Talk with your healthcare provider about different strategies to quit smoking. Some strategies include:  Quitting smoking altogether instead of gradually lessening how much you smoke over a period of time. Research shows that quitting "cold [place]" is more successful than gradually quitting.  Attending in-person counseling to help you build problem-solving skills. You are more likely to have success in quitting if you attend several counseling sessions. Even short sessions of 10 minutes can be effective.  Finding resources and support systems that can  help you to quit smoking and remain smoke-free after you quit. These resources are most helpful when you use them often. They can include: ? Online chats with a Social worker. ? Telephone quitlines. ? Careers information officer. ? Support groups or group counseling. ? Text messaging programs. ? Mobile phone applications.  Taking medicines to help you quit smoking. (If you are pregnant or  breastfeeding, talk with your health care provider first.) Some medicines contain nicotine and some do not. Both types of medicines help with cravings, but the medicines that include nicotine help to relieve withdrawal symptoms. Your health care provider may recommend: ? Nicotine patches, gum, or lozenges. ? Nicotine inhalers or sprays. ? Non-nicotine medicine that is taken by mouth. Talk with your health care provider about combining strategies, such as taking medicines while you are also receiving in-person counseling. Using these two strategies together makes you more likely to succeed in quitting than if you used either strategy on its own. If you are pregnant or breastfeeding, talk with your health care provider about finding counseling or other support strategies to quit smoking. Do not take medicine to help you quit smoking unless told to do so by your health care provider. What things can I do to make it easier to quit? Quitting smoking might feel overwhelming at first, but there is a lot that you can do to make it easier. Take these important actions:  Reach out to your family and friends and ask that they support and encourage you during this time. Call telephone quitlines, reach out to support groups, or work with a counselor for support.  Ask people who smoke to avoid smoking around you.  Avoid places that trigger you to smoke, such as bars, parties, or smoke-break areas at work.  Spend time around people who do not smoke.  Lessen stress in your life, because stress can be a smoking trigger for some  people. To lessen stress, try: ? Exercising regularly. ? Deep-breathing exercises. ? Yoga. ? Meditating. ? Performing a body scan. This involves closing your eyes, scanning your body from head to toe, and noticing which parts of your body are particularly tense. Purposefully relax the muscles in those areas.  Download or purchase mobile phone or tablet apps (applications) that can help you stick to your quit plan by providing reminders, tips, and encouragement. There are many free apps, such as QuitGuide from the [employer] Office manager for Disease Control and Prevention). You can find other support for quitting smoking (smoking cessation) through smokefree.gov and other websites. How will I feel when I quit smoking? Within the first 24 hours of quitting smoking, you may start to feel some withdrawal symptoms. These symptoms are usually most noticeable 2-3 days after quitting, but they usually do not last beyond 2-3 weeks. Changes or symptoms that you might experience include:  Mood swings.  Restlessness, anxiety, or irritation.  Difficulty concentrating.  Dizziness.  Strong cravings for sugary foods in addition to nicotine.  Mild weight gain.  Constipation.  Nausea.  Coughing or a sore throat.  Changes in how your medicines work in your body.  A depressed mood.  Difficulty sleeping (insomnia). After the first 2-3 weeks of quitting, you may start to notice more positive results, such as:  Improved sense of smell and taste.  Decreased coughing and sore throat.  Slower heart rate.  Lower blood pressure.  Clearer skin.  The ability to breathe more easily.  Fewer sick days. Quitting smoking is very challenging for most people. Do not get discouraged if you are not successful the first time. Some people need to make many attempts to quit before they achieve long-term success. Do your best to stick to your quit plan, and talk with your health care provider if you have any questions or  concerns. This information is not intended to replace advice given to you by your health care  provider. Make sure you discuss any questions you have with your health care provider. Document Released: 03/22/2001 Document Revised: 11/01/2016 Document Reviewed: 08/12/2014 Elsevier Interactive Patient Education  2019 [employer].

## 2018-07-31 ENCOUNTER — Telehealth: Payer: Self-pay

## 2018-07-31 NOTE — Telephone Encounter (Signed)
Pt called - states that his allergies are really bad this year. Wants to double up on his allergy med. Requesting feedback from provider and/or a new allergy rx to be sent to [facility]. Pls advise, thanks.

## 2018-08-01 MED ORDER — CETIRIZINE HCL 10 MG PO TABS: 10.0000 mg | ORAL_TABLET | Freq: Two times a day (BID) | ORAL | 1 refills | Status: DC

## 2018-08-01 NOTE — Telephone Encounter (Signed)
OK To double dose If that's not helping, would try adding Flonase or Nasonex steroid nasal spray

## 2018-08-02 NOTE — Telephone Encounter (Signed)
Left VM for Pt with recommendation. Callback information provided for any questions.

## 2018-11-21 ENCOUNTER — Encounter: Payer: Self-pay | Admitting: Osteopathic Medicine

## 2018-11-21 ENCOUNTER — Ambulatory Visit (INDEPENDENT_AMBULATORY_CARE_PROVIDER_SITE_OTHER): Payer: Managed Care, Other (non HMO) | Admitting: Osteopathic Medicine

## 2018-11-21 ENCOUNTER — Other Ambulatory Visit: Payer: Self-pay

## 2018-11-21 VITALS — BP 134/87 | HR 72 | Temp 98.0°F | Wt 218.6 lb

## 2018-11-21 DIAGNOSIS — D126 Benign neoplasm of colon, unspecified: Secondary | ICD-10-CM | POA: Insufficient documentation

## 2018-11-21 DIAGNOSIS — Z720 Tobacco use: Secondary | ICD-10-CM | POA: Diagnosis not present

## 2018-11-21 DIAGNOSIS — K648 Other hemorrhoids: Secondary | ICD-10-CM | POA: Insufficient documentation

## 2018-11-21 DIAGNOSIS — K5790 Diverticulosis of intestine, part unspecified, without perforation or abscess without bleeding: Secondary | ICD-10-CM

## 2018-11-21 DIAGNOSIS — Z122 Encounter for screening for malignant neoplasm of respiratory organs: Secondary | ICD-10-CM

## 2018-11-21 DIAGNOSIS — Z0000 Encounter for general adult medical examination without abnormal findings: Secondary | ICD-10-CM | POA: Diagnosis not present

## 2018-11-21 DIAGNOSIS — I10 Essential (primary) hypertension: Secondary | ICD-10-CM

## 2018-11-21 DIAGNOSIS — Z1211 Encounter for screening for malignant neoplasm of colon: Secondary | ICD-10-CM

## 2018-11-21 NOTE — Patient Instructions (Addendum)
General Preventive Care  Most recent routine screening lipids/other labs: ordered today   Blood pressure goal: at least 140/90 or less, ideally 130/80 or less  Tobacco: don't! Please let me know if you need help quitting!  Alcohol: responsible moderation is ok for most adults - if you have concerns about your alcohol intake, please talk to me!   Exercise: as tolerated to reduce risk of cardiovascular disease and diabetes. Strength training will also prevent osteoporosis.   Mental health: if need for mental health care (medicines, counseling, other), or concerns about moods, please let me know!   Sexual health: if need for STD testing, or if concerns with libido/pain problems, please let me know!  Advanced Directive: Living Will and/or Healthcare Power of Attorney recommended for all adults, regardless of age or health.  Vaccines  Flu vaccine: recommended for almost everyone, every fall.   Shingles vaccine: Shingrix all done!   Pneumonia vaccines: think about getting pneumonia shot!   Tetanus booster: Tdap recommended every 10 years. Think about getting this! Definitely if deep cut!  Cancer screenings   Colon cancer screening: referral in place for colonoscopy  Prostate cancer screening: PSA blood test around age 66  Lung cancer screening: CT chest every year for those age 11 to 62 years old with ?30 pack year smoking history, who either currently smoke or have quit within the past 15 years. Infection screenings . HIV: screening done . Gonorrhea/Chlamydia: screening as needed.  . Hepatitis C: screening done . TB: certain at-risk populations, or depending on work requirements and/or travel history Other . Bone Density Test: recommended for men at age 58, sooner depending on risk factors . Abdominal Aortic Aneurysm: screening with ultrasound recommended once for men age 83-75 who have ever smoked      Immunization History  Administered Date(s) Administered  . Influenza  Split 01/22/2010  . Influenza, Seasonal, Injecte, Preservative Fre 03/12/2014, 03/18/2015  . Influenza,inj,Quad PF,6+ Mos 05/30/2017  . Influenza-Unspecified 03/04/2011, 04/20/2012, 02/22/2013  . Zoster Recombinat (Shingrix) 02/28/2018, 05/23/2018

## 2018-11-21 NOTE — Progress Notes (Signed)
HPI: James Alexander is a 63 y.o. male who  has a past medical history of High blood pressure.  he presents to [facility] today, 11/21/18,  for chief complaint of: Annual physical     Patient here for annual physical / wellness exam.  See preventive care reviewed as below.   Additional concerns today include:  None!  Hypertension: In February, patient was advised to follow-up for nurse visit to verify home blood pressure monitor, does not look like he followed up for this.  BP Readings from Last 3 Encounters:  11/21/18 134/87  05/23/18 (!) 147/85  02/28/18 134/84      Past medical, surgical, social and family history reviewed:  Patient Active Problem List   Diagnosis Date Noted  . Internal hemorrhoid 11/21/2018  . Diverticulosis 11/21/2018  . Adenomatous polyp of colon 11/21/2018  . Hypertension 05/30/2017  . Hypotestosteronism 05/30/2017  . Tobacco use 07/19/2016    No past surgical history on file.  Social History   Tobacco Use  . Smoking status: Current Every Day Smoker  . Smokeless tobacco: Never Used  . Tobacco comment: 30+ pack years, has cut back to 1 ppd  Substance Use Topics  . Alcohol use: Yes    Family History  Problem Relation Age of Onset  . Liver cancer Mother   . Heart attack Father   . High blood pressure Father      Current medication list and allergy/intolerance information reviewed:    Current Outpatient Medications  Medication Sig Dispense Refill  . amLODipine (NORVASC) 10 MG tablet Take 1 tablet (10 mg total) by mouth daily. 90 tablet 3  . cetirizine (ZYRTEC) 10 MG tablet Take 1 tablet (10 mg total) by mouth 2 (two) times daily. 180 tablet 1  . hydrochlorothiazide (HYDRODIURIL) 25 MG tablet Take 1 tablet (25 mg total) by mouth daily. 90 tablet 3   No current facility-administered medications for this visit.     Allergies  Allergen Reactions  . Penicillins Shortness Of Breath and Swelling       Review of Systems:  Constitutional:  No  fever, no chills, No recent illness, No unintentional weight changes. No significant fatigue.   HEENT: No  headache, no vision change, no hearing change, No sore throat, No  sinus pressure  Cardiac: No  chest pain, No  pressure, No palpitations, No  Orthopnea  Respiratory:  No  shortness of breath. No  Cough  Gastrointestinal: No  abdominal pain, No  nausea, No  vomiting,  No  blood in stool, No  diarrhea, No  constipation   Musculoskeletal: No new myalgia/arthralgia  Skin: No  Rash, No other wounds/concerning lesions  Genitourinary: No  incontinence, No  abnormal genital bleeding, No abnormal genital discharge  Hem/Onc: No  easy bruising/bleeding  Endocrine: No cold intolerance,  No heat intolerance. No polyuria/polydipsia/polyphagia   Neurologic: No  weakness, No  dizziness, No  slurred speech/focal weakness/facial droop  Psychiatric: No  concerns with depression, No  concerns with anxiety, No sleep problems, No mood problems  Exam:  BP 134/87 (BP Location: Left Arm, Patient Position: Sitting, Cuff Size: Normal)   Pulse 72   Temp 98 F (36.7 C) (Oral)   Wt 218 lb 9.6 oz (99.2 kg)   Constitutional: VS see above. General Appearance: alert, well-developed, well-nourished, NAD  Eyes: Normal lids and conjunctive, non-icteric sclera  Neck: No masses, trachea midline. No thyroid enlargement. No tenderness/mass appreciated. No lymphadenopathy  Respiratory: Normal respiratory effort.  no wheeze, no rhonchi, no rales  Cardiovascular: S1/S2 normal, no murmur, no rub/gallop auscultated. RRR. No lower extremity edema.   Gastrointestinal: Nontender, no masses. No hepatomegaly, no splenomegaly. No hernia appreciated. Bowel sounds normal. Rectal exam deferred.   Musculoskeletal: Gait normal. No clubbing/cyanosis of digits.   Neurological: Normal balance/coordination. No tremor. No cranial nerve deficit on limited exam. Motor and sensation  intact and symmetric. Cerebellar reflexes intact.   Skin: warm, dry, intact.  Psychiatric: Normal judgment/insight. Normal mood and affect. Oriented x3.    No results found for this or any previous visit (from the past 72 hour(s)).  No results found.   ASSESSMENT/PLAN: The primary encounter diagnosis was Annual physical exam. Diagnoses of Essential hypertension, Tobacco use, Adenomatous polyp of colon, unspecified part of colon, Diverticulosis, Internal hemorrhoid, Encounter for screening for malignant neoplasm of respiratory organs, and Colon cancer screening were also pertinent to this visit.   Orders Placed This Encounter  Procedures  . CT CHEST LUNG CA SCREEN LOW DOSE W/O CM  . CBC  . COMPLETE METABOLIC PANEL WITH GFR  . Lipid panel  . PSA, Total with Reflex to PSA, Free  . Ambulatory referral to Gastroenterology    No orders of the defined types were placed in this encounter.   Patient Instructions   General Preventive Care  Most recent routine screening lipids/other labs: ordered today   Blood pressure goal: at least 140/90 or less, ideally 130/80 or less  Tobacco: don't! Please let me know if you need help quitting!  Alcohol: responsible moderation is ok for most adults - if you have concerns about your alcohol intake, please talk to me!   Exercise: as tolerated to reduce risk of cardiovascular disease and diabetes. Strength training will also prevent osteoporosis.   Mental health: if need for mental health care (medicines, counseling, other), or concerns about moods, please let me know!   Sexual health: if need for STD testing, or if concerns with libido/pain problems, please let me know!  Advanced Directive: Living Will and/or Healthcare Power of Attorney recommended for all adults, regardless of age or health.  Vaccines  Flu vaccine: recommended for almost everyone, every fall.   Shingles vaccine: Shingrix all done!   Pneumonia vaccines: think about  getting pneumonia shot!   Tetanus booster: Tdap recommended every 10 years. Think about getting this! Definitely if deep cut!  Cancer screenings   Colon cancer screening: referral in place for colonoscopy  Prostate cancer screening: PSA blood test around age 15  Lung cancer screening: CT chest every year for those age 78 to 62 years old with ?30 pack year smoking history, who either currently smoke or have quit within the past 15 years. Infection screenings . HIV: screening done . Gonorrhea/Chlamydia: screening as needed.  . Hepatitis C: screening done . TB: certain at-risk populations, or depending on work requirements and/or travel history Other . Bone Density Test: recommended for men at age 70, sooner depending on risk factors . Abdominal Aortic Aneurysm: screening with ultrasound recommended once for men age 36-75 who have ever smoked      Immunization History  Administered Date(s) Administered  . Influenza Split 01/22/2010  . Influenza, Seasonal, Injecte, Preservative Fre 03/12/2014, 03/18/2015  . Influenza,inj,Quad PF,6+ Mos 05/30/2017  . Influenza-Unspecified 03/04/2011, 04/20/2012, 02/22/2013  . Zoster Recombinat (Shingrix) 02/28/2018, 05/23/2018               Visit summary with medication list and pertinent instructions was printed for patient to review. All  questions at time of visit were answered - patient instructed to contact office with any additional concerns or updates. ER/RTC precautions were reviewed with the patient.      Please note: voice recognition software was used to produce this document, and typos may escape review. Please contact Dr. Sheppard Coil for any needed clarifications.     Follow-up plan: Return in about 6 months (around 05/24/2019) for monitor blood pressure, sooner if needed.

## 2018-11-22 LAB — COMPLETE METABOLIC PANEL WITH GFR
AG Ratio: 1.4 (calc) (ref 1.0–2.5)
ALT: 20 U/L (ref 9–46)
AST: 18 U/L (ref 10–35)
Albumin: 4.4 g/dL (ref 3.6–5.1)
Alkaline phosphatase (APISO): 99 U/L (ref 35–144)
BUN: 13 mg/dL (ref 7–25)
CO2: 27 mmol/L (ref 20–32)
Calcium: 9.8 mg/dL (ref 8.6–10.3)
Chloride: 102 mmol/L (ref 98–110)
Creat: 1.05 mg/dL (ref 0.70–1.25)
GFR, Est African American: 88 mL/min/{1.73_m2} (ref >=60)
GFR, Est Non African American: 76 mL/min/{1.73_m2} (ref >=60)
Globulin: 3.1 g/dL (calc) (ref 1.9–3.7)
Glucose, Bld: 95 mg/dL (ref 65–99)
Potassium: 4.5 mmol/L (ref 3.5–5.3)
Sodium: 137 mmol/L (ref 135–146)
Total Bilirubin: 0.6 mg/dL (ref 0.2–1.2)
Total Protein: 7.5 g/dL (ref 6.1–8.1)

## 2018-11-22 LAB — CBC
HCT: 53.1 % — ABNORMAL HIGH (ref 38.5–50.0)
Hemoglobin: 17.8 g/dL — ABNORMAL HIGH (ref 13.2–17.1)
MCH: 27.1 pg (ref 27.0–33.0)
MCHC: 33.5 g/dL (ref 32.0–36.0)
MCV: 80.9 fL (ref 80.0–100.0)
MPV: 11 fL (ref 7.5–12.5)
Platelets: 213 10*3/uL (ref 140–400)
RBC: 6.56 10*6/uL — ABNORMAL HIGH (ref 4.20–5.80)
RDW: 14.2 % (ref 11.0–15.0)
WBC: 9.2 10*3/uL (ref 3.8–10.8)

## 2018-11-22 LAB — LIPID PANEL
Cholesterol: 188 mg/dL (ref <200)
HDL: 50 mg/dL (ref >=40)
LDL Cholesterol (Calc): 112 mg/dL (calc) — ABNORMAL HIGH
Non-HDL Cholesterol (Calc): 138 mg/dL (calc) — ABNORMAL HIGH (ref <130)
Total CHOL/HDL Ratio: 3.8 (calc) (ref <5.0)
Triglycerides: 148 mg/dL (ref <150)

## 2018-11-22 LAB — PSA, TOTAL WITH REFLEX TO PSA, FREE: PSA, Total: 0.5 ng/mL (ref <=4.0)

## 2018-11-29 MED ORDER — ATORVASTATIN CALCIUM 20 MG PO TABS: 20.0000 mg | ORAL_TABLET | Freq: Every day | ORAL | 3 refills | Status: DC

## 2018-11-29 NOTE — Addendum Note (Signed)
Addended by: Maryla Morrow on: 11/29/2018 04:31 PM   Modules accepted: Orders

## 2018-12-03 MED ORDER — BUPROPION HCL ER (XL) 150 MG PO TB24: 150.0000 mg | ORAL_TABLET | ORAL | 0 refills | Status: DC

## 2018-12-03 NOTE — Addendum Note (Signed)
Addended by: Maryla Morrow on: 12/03/2018 07:44 AM   Modules accepted: Orders

## 2019-02-08 ENCOUNTER — Other Ambulatory Visit: Payer: Self-pay | Admitting: Osteopathic Medicine

## 2019-02-21 LAB — HM COLONOSCOPY

## 2019-03-15 ENCOUNTER — Telehealth: Payer: Self-pay

## 2019-03-15 NOTE — Telephone Encounter (Signed)
He still needs appointment if he wants medicine.  Thanks KG LPN

## 2019-03-15 NOTE — Telephone Encounter (Signed)
Per patient he said that he thinks its a common cold. Offered a virtual visit and patient declined. He wanted something called in. Patient states that he can still taste his food and no fever. He wanted to just get something called in. The only time that he would be available would be next Friday. Patient said he would see if this cold would run its course. No further questions at this time.

## 2019-03-15 NOTE — Telephone Encounter (Signed)
Pt left a vm msg requesting a call back. Pt has been having sinus drainage, feeling achy and has a cough going on for a few days. Pt thinks it may be a bad cold/Flu or sinus infection. Pls contact pt for scheduling. Thanks.

## 2019-03-15 NOTE — Telephone Encounter (Signed)
Patient declined appointment. He will call back if he changes his mind.

## 2019-04-17 ENCOUNTER — Other Ambulatory Visit: Payer: Self-pay | Admitting: Osteopathic Medicine

## 2019-05-11 ENCOUNTER — Other Ambulatory Visit: Payer: Self-pay | Admitting: Osteopathic Medicine

## 2019-05-24 ENCOUNTER — Encounter: Payer: Self-pay | Admitting: Osteopathic Medicine

## 2019-05-24 ENCOUNTER — Other Ambulatory Visit: Payer: Self-pay

## 2019-05-24 ENCOUNTER — Ambulatory Visit: Payer: Managed Care, Other (non HMO) | Admitting: Osteopathic Medicine

## 2019-05-24 VITALS — BP 145/84 | HR 73 | Temp 98.1°F | Wt 224.0 lb

## 2019-05-24 DIAGNOSIS — I10 Essential (primary) hypertension: Secondary | ICD-10-CM

## 2019-05-24 DIAGNOSIS — Z20822 Contact with and (suspected) exposure to covid-19: Secondary | ICD-10-CM | POA: Diagnosis not present

## 2019-05-24 DIAGNOSIS — Z23 Encounter for immunization: Secondary | ICD-10-CM

## 2019-05-24 MED ORDER — HYDROCHLOROTHIAZIDE 25 MG PO TABS: 25.0000 mg | ORAL_TABLET | Freq: Every day | ORAL | 3 refills | Status: DC

## 2019-05-24 MED ORDER — AMLODIPINE-ATORVASTATIN 10-20 MG PO TABS: 1.0000 | ORAL_TABLET | Freq: Every day | ORAL | 3 refills | Status: DC

## 2019-05-24 MED ORDER — MUCINEX 600 MG PO TB12: 600.0000 mg | ORAL_TABLET | Freq: Two times a day (BID) | ORAL | 1 refills | Status: AC

## 2019-05-24 NOTE — Progress Notes (Signed)
James Alexander is a 64 y.o. male who presents to  [place] at [facility]  today, 05/24/19, seeking care for the following:  The primary encounter diagnosis was Essential hypertension. Diagnoses of Need for influenza vaccination, Need for Tdap vaccination, and Suspected COVID-19 virus infection were also pertinent to this visit.    ASSESSMENT & PLAN with other pertinent history/findings:  1. Need for influenza vaccination done  2. Need for Tdap vaccination done  3. Suspected COVID-19 virus infection - past Pt would like Ab test for possible previous illness in Dec 2020 Extensive education on vaccine - I encouraged him to get this!   4. Essential hypertension Above goal Pt would like to work on salt reduction Advised quit smoking  Discussed ASCVD risk    BP Readings from Last 3 Encounters:  05/24/19  145/84  11/21/18 134/87  05/23/18 147/85  02/28/18 134/84      Orders Placed This Encounter  Procedures  . Tdap vaccine greater than or equal to 7yo IM  . Flu Vaccine QUAD 6+ mos PF IM (Fluarix Quad PF)  . SAR CoV2 Serology (COVID 19)AB(IGG)IA    Meds ordered this encounter  Medications  . guaiFENesin (MUCINEX) 600 MG 12 hr tablet    Sig: Take 1-2 tablets (600-1,200 mg total) by mouth 2 (two) times daily.    Dispense:  360 tablet    Refill:  1  . amlodipine-atorvastatin (CADUET) 10-20 MG tablet    Sig: Take 1 tablet by mouth daily.    Dispense:  90 tablet    Refill:  3  . hydrochlorothiazide (HYDRODIURIL) 25 MG tablet    Sig: Take 1 tablet (25 mg total) by mouth daily.    Dispense:  90 tablet    Refill:  3    Patient Instructions  DASH Eating Plan DASH stands for "Dietary Approaches to Stop Hypertension." The DASH eating plan is a healthy eating plan that has been shown to reduce high blood pressure (hypertension). It may also reduce your risk for type 2 diabetes, heart disease, and stroke. The DASH eating plan may  also help with weight loss. What are tips for following this plan?  General guidelines  Avoid eating more than 2,300 mg (milligrams) of salt (sodium) a day. If you have hypertension, you may need to reduce your sodium intake to 1,500 mg a day.  Limit alcohol intake to no more than 1 drink a day for nonpregnant women and 2 drinks a day for men. One drink equals 12 oz of beer, 5 oz of wine, or 1 oz of hard liquor.  Work with your health care provider to maintain a healthy body weight or to lose weight. Ask what an ideal weight is for you.  Get at least 30 minutes of exercise that causes your heart to beat faster (aerobic exercise) most days of the week. Activities may include walking, swimming, or biking.  Work with your health care provider or diet and nutrition specialist (dietitian) to adjust your eating plan to your individual calorie needs. Reading food labels   Check food labels for the amount of sodium per serving. Choose foods with less than 5 percent of the Daily Value of sodium. Generally, foods with less than 300 mg of sodium per serving fit into this eating plan.  To find whole grains, look for the word "whole" as the first word in the ingredient list. Shopping  Buy products labeled as "low-sodium" or "no salt added."  Buy fresh foods. Avoid canned foods and premade or frozen meals. Cooking  Avoid adding salt when cooking. Use salt-free seasonings or herbs instead of table salt or sea salt. Check with your health care provider or pharmacist before using salt substitutes.  Do not fry foods. Cook foods using healthy methods such as baking, boiling, grilling, and broiling instead.  Cook with heart-healthy oils, such as olive, canola, soybean, or sunflower oil. Meal planning  Eat a balanced diet that includes: ? 5 or more servings of fruits and vegetables each day. At each meal, try to fill half of your plate with fruits and vegetables. ? Up to 6-8 servings of whole grains  each day. ? Less than 6 oz of lean meat, poultry, or fish each day. A 3-oz serving of meat is about the same size as a deck of cards. One egg equals 1 oz. ? 2 servings of low-fat dairy each day. ? A serving of nuts, seeds, or beans 5 times each week. ? Heart-healthy fats. Healthy fats called Omega-3 fatty acids are found in foods such as flaxseeds and coldwater fish, like sardines, salmon, and mackerel.  Limit how much you eat of the following: ? Canned or prepackaged foods. ? Food that is high in trans fat, such as fried foods. ? Food that is high in saturated fat, such as fatty meat. ? Sweets, desserts, sugary drinks, and other foods with added sugar. ? Full-fat dairy products.  Do not salt foods before eating.  Try to eat at least 2 vegetarian meals each week.  Eat more home-cooked food and less restaurant, buffet, and fast food.  When eating at a restaurant, ask that your food be prepared with less salt or no salt, if possible. What foods are recommended? The items listed may not be a complete list. Talk with your dietitian about what dietary choices are best for you. Grains Whole-grain or whole-wheat bread. Whole-grain or whole-wheat pasta. Brown rice. [place] Morrow. Bulgur. Whole-grain and low-sodium cereals. Pita bread. Low-fat, low-sodium crackers. Whole-wheat flour tortillas. Vegetables Fresh or frozen vegetables (raw, steamed, roasted, or grilled). Low-sodium or reduced-sodium tomato and vegetable juice. Low-sodium or reduced-sodium tomato sauce and tomato paste. Low-sodium or reduced-sodium canned vegetables. Fruits All fresh, dried, or frozen fruit. Canned fruit in natural juice (without added sugar). Meat and other protein foods Skinless chicken or [place]. Ground chicken or [place]. Pork with fat trimmed off. Fish and seafood. Egg whites. Dried beans, peas, or lentils. Unsalted nuts, nut butters, and seeds. Unsalted canned beans. Lean cuts of beef with fat trimmed off.  Low-sodium, lean deli meat. Dairy Low-fat (1%) or fat-free (skim) milk. Fat-free, low-fat, or reduced-fat cheeses. Nonfat, low-sodium ricotta or cottage cheese. Low-fat or nonfat yogurt. Low-fat, low-sodium cheese. Fats and oils Soft margarine without trans fats. Vegetable oil. Low-fat, reduced-fat, or light mayonnaise and salad dressings (reduced-sodium). Canola, safflower, olive, soybean, and sunflower oils. Avocado. Seasoning and other foods Herbs. Spices. Seasoning mixes without salt. Unsalted popcorn and pretzels. Fat-free sweets. What foods are not recommended? The items listed may not be a complete list. Talk with your dietitian about what dietary choices are best for you. Grains Baked goods made with fat, such as croissants, muffins, or some breads. Dry pasta or rice meal packs. Vegetables Creamed or fried vegetables. Vegetables in a cheese sauce. Regular canned vegetables (not low-sodium or reduced-sodium). Regular canned tomato sauce and paste (not low-sodium or reduced-sodium). Regular tomato and vegetable juice (not low-sodium or reduced-sodium). Angie Fava. Olives. Fruits Canned fruit in a  light or heavy syrup. Fried fruit. Fruit in cream or butter sauce. Meat and other protein foods Fatty cuts of meat. Ribs. Fried meat. Berniece Salines. Sausage. Bologna and other processed lunch meats. Salami. Fatback. Hotdogs. Bratwurst. Salted nuts and seeds. Canned beans with added salt. Canned or smoked fish. Whole eggs or egg yolks. Chicken or [place] with skin. Dairy Whole or 2% milk, cream, and half-and-half. Whole or full-fat cream cheese. Whole-fat or sweetened yogurt. Full-fat cheese. Nondairy creamers. Whipped toppings. Processed cheese and cheese spreads. Fats and oils Butter. Stick margarine. Lard. Shortening. Ghee. Bacon fat. Tropical oils, such as coconut, palm kernel, or palm oil. Seasoning and other foods Salted popcorn and pretzels. Onion salt, garlic salt, seasoned salt, table salt, and sea  salt. Worcestershire sauce. Tartar sauce. Barbecue sauce. Teriyaki sauce. Soy sauce, including reduced-sodium. Steak sauce. Canned and packaged gravies. Fish sauce. Oyster sauce. Cocktail sauce. Horseradish that you find on the shelf. Ketchup. Mustard. Meat flavorings and tenderizers. Bouillon cubes. Hot sauce and Tabasco sauce. Premade or packaged marinades. Premade or packaged taco seasonings. Relishes. Regular salad dressings. Where to find more information:  National Heart, Lung, and Drain: https://wilson-eaton.com/  American Heart Association: www.heart.org Summary  The DASH eating plan is a healthy eating plan that has been shown to reduce high blood pressure (hypertension). It may also reduce your risk for type 2 diabetes, heart disease, and stroke.  With the DASH eating plan, you should limit salt (sodium) intake to 2,300 mg a day. If you have hypertension, you may need to reduce your sodium intake to 1,500 mg a day.  When on the DASH eating plan, aim to eat more fresh fruits and vegetables, whole grains, lean proteins, low-fat dairy, and heart-healthy fats.  Work with your health care provider or diet and nutrition specialist (dietitian) to adjust your eating plan to your individual calorie needs. This information is not intended to replace advice given to you by your health care provider. Make sure you discuss any questions you have with your health care provider. Document Revised: 03/10/2017 Document Reviewed: 03/21/2016 Elsevier Patient Education  2020 [employer].      Follow-up instructions: Return for OV 40 for skin procedure .                                       BP (!) 145/84 (BP Location: Left Arm, Patient Position: Sitting, Cuff Size: Large)   Pulse 73   Temp 98.1 F (36.7 C) (Oral)   Wt 224 lb 0.6 oz (101.6 kg)   No outpatient medications have been marked as taking for the 05/24/19 encounter (Office Visit) with Emeterio Reeve, DO.    No results found for this or any previous visit (from the past 72 hour(s)).  No results found.  Depression screen [facility] [place] 2/9 11/21/2018 05/23/2018 11/30/2017  Decreased Interest 0 0 0  Down, Depressed, Hopeless 0 0 0  PHQ - 2 Score 0 0 0  Altered sleeping - - 0  Tired, decreased energy - - 0  Change in appetite - - 0  Feeling bad or failure about yourself  - - 0  Trouble concentrating - - 0  Moving slowly or fidgety/restless - - 0  Suicidal thoughts - - 0  PHQ-9 Score - - 0  Difficult doing work/chores - - Not difficult at all    GAD 7 : Generalized Anxiety Score 11/21/2018 05/23/2018 11/30/2017 05/30/2017  Nervous, Anxious, on Edge 0 0 0 0  Control/stop worrying 0 0 0 0  Worry too much - different things 0 0 0 0  Trouble relaxing 0 0 0 0  Restless 0 0 0 0  Easily annoyed or irritable 0 0 0 0  Afraid - awful might happen 0 0 0 0  Total GAD 7 Score 0 0 0 0  Anxiety Difficulty Not difficult at all Not difficult at all Not difficult at all Not difficult at all      All questions at time of visit were answered - patient instructed to contact office with any additional concerns or updates.  ER/RTC precautions were reviewed with the patient.  Please note: voice recognition software was used to produce this document, and typos may escape review. Please contact Dr. Sheppard Coil for any needed clarifications.   Total encounter time: 40 minutes.

## 2019-05-24 NOTE — Patient Instructions (Signed)
DASH Eating Plan DASH stands for "Dietary Approaches to Stop Hypertension." The DASH eating plan is a healthy eating plan that has been shown to reduce high blood pressure (hypertension). It may also reduce your risk for type 2 diabetes, heart disease, and stroke. The DASH eating plan may also help with weight loss. What are tips for following this plan?  General guidelines  Avoid eating more than 2,300 mg (milligrams) of salt (sodium) a day. If you have hypertension, you may need to reduce your sodium intake to 1,500 mg a day.  Limit alcohol intake to no more than 1 drink a day for nonpregnant women and 2 drinks a day for men. One drink equals 12 oz of beer, 5 oz of wine, or 1 oz of hard liquor.  Work with your health care provider to maintain a healthy body weight or to lose weight. Ask what an ideal weight is for you.  Get at least 30 minutes of exercise that causes your heart to beat faster (aerobic exercise) most days of the week. Activities may include walking, swimming, or biking.  Work with your health care provider or diet and nutrition specialist (dietitian) to adjust your eating plan to your individual calorie needs. Reading food labels   Check food labels for the amount of sodium per serving. Choose foods with less than 5 percent of the Daily Value of sodium. Generally, foods with less than 300 mg of sodium per serving fit into this eating plan.  To find whole grains, look for the word "whole" as the first word in the ingredient list. Shopping  Buy products labeled as "low-sodium" or "no salt added."  Buy fresh foods. Avoid canned foods and premade or frozen meals. Cooking  Avoid adding salt when cooking. Use salt-free seasonings or herbs instead of table salt or sea salt. Check with your health care provider or pharmacist before using salt substitutes.  Do not fry foods. Cook foods using healthy methods such as baking, boiling, grilling, and broiling instead.  Cook with  heart-healthy oils, such as olive, canola, soybean, or sunflower oil. Meal planning  Eat a balanced diet that includes: ? 5 or more servings of fruits and vegetables each day. At each meal, try to fill half of your plate with fruits and vegetables. ? Up to 6-8 servings of whole grains each day. ? Less than 6 oz of lean meat, poultry, or fish each day. A 3-oz serving of meat is about the same size as a deck of cards. One egg equals 1 oz. ? 2 servings of low-fat dairy each day. ? A serving of nuts, seeds, or beans 5 times each week. ? Heart-healthy fats. Healthy fats called Omega-3 fatty acids are found in foods such as flaxseeds and coldwater fish, like sardines, salmon, and mackerel.  Limit how much you eat of the following: ? Canned or prepackaged foods. ? Food that is high in trans fat, such as fried foods. ? Food that is high in saturated fat, such as fatty meat. ? Sweets, desserts, sugary drinks, and other foods with added sugar. ? Full-fat dairy products.  Do not salt foods before eating.  Try to eat at least 2 vegetarian meals each week.  Eat more home-cooked food and less restaurant, buffet, and fast food.  When eating at a restaurant, ask that your food be prepared with less salt or no salt, if possible. What foods are recommended? The items listed may not be a complete list. Talk with your dietitian about   what dietary choices are best for you. Grains Whole-grain or whole-wheat bread. Whole-grain or whole-wheat pasta. Brown rice. Oatmeal. Quinoa. Bulgur. Whole-grain and low-sodium cereals. Pita bread. Low-fat, low-sodium crackers. Whole-wheat flour tortillas. Vegetables Fresh or frozen vegetables (raw, steamed, roasted, or grilled). Low-sodium or reduced-sodium tomato and vegetable juice. Low-sodium or reduced-sodium tomato sauce and tomato paste. Low-sodium or reduced-sodium canned vegetables. Fruits All fresh, dried, or frozen fruit. Canned fruit in natural juice (without  added sugar). Meat and other protein foods Skinless chicken or turkey. Ground chicken or turkey. Pork with fat trimmed off. Fish and seafood. Egg whites. Dried beans, peas, or lentils. Unsalted nuts, nut butters, and seeds. Unsalted canned beans. Lean cuts of beef with fat trimmed off. Low-sodium, lean deli meat. Dairy Low-fat (1%) or fat-free (skim) milk. Fat-free, low-fat, or reduced-fat cheeses. Nonfat, low-sodium ricotta or cottage cheese. Low-fat or nonfat yogurt. Low-fat, low-sodium cheese. Fats and oils Soft margarine without trans fats. Vegetable oil. Low-fat, reduced-fat, or light mayonnaise and salad dressings (reduced-sodium). Canola, safflower, olive, soybean, and sunflower oils. Avocado. Seasoning and other foods Herbs. Spices. Seasoning mixes without salt. Unsalted popcorn and pretzels. Fat-free sweets. What foods are not recommended? The items listed may not be a complete list. Talk with your dietitian about what dietary choices are best for you. Grains Baked goods made with fat, such as croissants, muffins, or some breads. Dry pasta or rice meal packs. Vegetables Creamed or fried vegetables. Vegetables in a cheese sauce. Regular canned vegetables (not low-sodium or reduced-sodium). Regular canned tomato sauce and paste (not low-sodium or reduced-sodium). Regular tomato and vegetable juice (not low-sodium or reduced-sodium). Pickles. Olives. Fruits Canned fruit in a light or heavy syrup. Fried fruit. Fruit in cream or butter sauce. Meat and other protein foods Fatty cuts of meat. Ribs. Fried meat. Bacon. Sausage. Bologna and other processed lunch meats. Salami. Fatback. Hotdogs. Bratwurst. Salted nuts and seeds. Canned beans with added salt. Canned or smoked fish. Whole eggs or egg yolks. Chicken or turkey with skin. Dairy Whole or 2% milk, cream, and half-and-half. Whole or full-fat cream cheese. Whole-fat or sweetened yogurt. Full-fat cheese. Nondairy creamers. Whipped toppings.  Processed cheese and cheese spreads. Fats and oils Butter. Stick margarine. Lard. Shortening. Ghee. Bacon fat. Tropical oils, such as coconut, palm kernel, or palm oil. Seasoning and other foods Salted popcorn and pretzels. Onion salt, garlic salt, seasoned salt, table salt, and sea salt. Worcestershire sauce. Tartar sauce. Barbecue sauce. Teriyaki sauce. Soy sauce, including reduced-sodium. Steak sauce. Canned and packaged gravies. Fish sauce. Oyster sauce. Cocktail sauce. Horseradish that you find on the shelf. Ketchup. Mustard. Meat flavorings and tenderizers. Bouillon cubes. Hot sauce and Tabasco sauce. Premade or packaged marinades. Premade or packaged taco seasonings. Relishes. Regular salad dressings. Where to find more information:  National Heart, Lung, and Blood Institute: www.nhlbi.nih.gov  American Heart Association: www.heart.org Summary  The DASH eating plan is a healthy eating plan that has been shown to reduce high blood pressure (hypertension). It may also reduce your risk for type 2 diabetes, heart disease, and stroke.  With the DASH eating plan, you should limit salt (sodium) intake to 2,300 mg a day. If you have hypertension, you may need to reduce your sodium intake to 1,500 mg a day.  When on the DASH eating plan, aim to eat more fresh fruits and vegetables, whole grains, lean proteins, low-fat dairy, and heart-healthy fats.  Work with your health care provider or diet and nutrition specialist (dietitian) to adjust your eating plan to your   individual calorie needs. This information is not intended to replace advice given to you by your health care provider. Make sure you discuss any questions you have with your health care provider. Document Revised: 03/10/2017 Document Reviewed: 03/21/2016 Elsevier Patient Education  2020 Elsevier Inc.  

## 2019-05-25 LAB — SAR COV2 SEROLOGY (COVID19)AB(IGG),IA: SARS CoV2 AB IGG: NEGATIVE

## 2019-05-29 ENCOUNTER — Ambulatory Visit: Payer: Managed Care, Other (non HMO) | Admitting: Osteopathic Medicine

## 2019-06-07 ENCOUNTER — Encounter: Payer: Self-pay | Admitting: Osteopathic Medicine

## 2019-06-07 ENCOUNTER — Ambulatory Visit (INDEPENDENT_AMBULATORY_CARE_PROVIDER_SITE_OTHER): Payer: Managed Care, Other (non HMO) | Admitting: Osteopathic Medicine

## 2019-06-07 ENCOUNTER — Other Ambulatory Visit: Payer: Self-pay

## 2019-06-07 VITALS — BP 130/86 | HR 76 | Temp 98.2°F | Wt 225.0 lb

## 2019-06-07 DIAGNOSIS — L723 Sebaceous cyst: Secondary | ICD-10-CM | POA: Diagnosis not present

## 2019-06-07 NOTE — Progress Notes (Signed)
James Alexander is a 64 y.o. male who presents to  [place] at [facility]  today, 06/07/19, seeking care for the following: . Skin procedure      ASSESSMENT & PLAN with other pertinent history/findings:  The encounter diagnosis was Sebaceous cyst. x2   PRE-OP DIAGNOSIS: Sebaceous cyst x2: on L neck and R chest  POST-OP DIAGNOSIS: Same  PROCEDURE: removal subcutaneous cyst  Performing Physician: Emeterio Reeve    Informed consent obtained after discussion of risks vs benefits.   The areas surrounding the skin lesions were prepared and draped in the usual sterile manner.   R chest: linear incision made, cyst removed without difficulty,  L neck: linear incision made, cyst removed with moderate difficulty d/t scarring around the somewhat elongated cyst.    Closure:      R chest: subq vicryl x1, glue, steri-strips  L neck: subq vicryl x3, simple interrupted cutaneous x2, glue, steri-strips    Followup: The patient tolerated the procedure well without complications.  Standard post-procedure care is explained and return precautions are given.   Follow-up instructions: Return in about 1 week (around 06/14/2019) for SUTURE REMOVAL - see me sooner if needed.                                         BP 130/86 (BP Location: Left Arm, Patient Position: Sitting, Cuff Size: Normal)   Pulse 76   Temp 98.2 F (36.8 C) (Oral)   Wt 225 lb 0.6 oz (102.1 kg)   Current Meds  Medication Sig  . amlodipine-atorvastatin (CADUET) 10-20 MG tablet Take 1 tablet by mouth daily.  . cetirizine (ZYRTEC) 10 MG tablet Take 1 tablet (10 mg total) by mouth 2 (two) times daily.  [name] Kitchen guaiFENesin (MUCINEX) 600 MG 12 hr tablet Take 1-2 tablets (600-1,200 mg total) by mouth 2 (two) times daily.  . hydrochlorothiazide (HYDRODIURIL) 25 MG tablet Take 1 tablet (25 mg total) by mouth daily.    No results found for this or any  previous visit (from the past 72 hour(s)).  No results found.  Depression screen [facility] 2/9 11/21/2018 05/23/2018 11/30/2017  Decreased Interest 0 0 0  Down, Depressed, Hopeless 0 0 0  PHQ - 2 Score 0 0 0  Altered sleeping - - 0  Tired, decreased energy - - 0  Change in appetite - - 0  Feeling bad or failure about yourself  - - 0  Trouble concentrating - - 0  Moving slowly or fidgety/restless - - 0  Suicidal thoughts - - 0  PHQ-9 Score - - 0  Difficult doing work/chores - - Not difficult at all    GAD 7 : Generalized Anxiety Score 11/21/2018 05/23/2018 11/30/2017 05/30/2017  Nervous, Anxious, on Edge 0 0 0 0  Control/stop worrying 0 0 0 0  Worry too much - different things 0 0 0 0  Trouble relaxing 0 0 0 0  Restless 0 0 0 0  Easily annoyed or irritable 0 0 0 0  Afraid - awful might happen 0 0 0 0  Total GAD 7 Score 0 0 0 0  Anxiety Difficulty Not difficult at all Not difficult at all Not difficult at all Not difficult at all      All questions at time of visit were answered - patient instructed to contact office with any additional concerns or  updates.  ER/RTC precautions were reviewed with the patient.  Please note: voice recognition software was used to produce this document, and typos may escape review. Please contact Dr. Sheppard Coil for any needed clarifications.

## 2019-06-07 NOTE — Patient Instructions (Signed)
Sterile Tape Wound Care Some cuts and wounds can be closed using sterile tape, also called skin adhesive strips. Skin adhesive strips can be used for shallow (superficial) and simple cuts, wounds, lacerations, and some surgical incisions. These strips act in place of stitches, or in addition to stitches, to hold the edges of the wound together to allow for better healing. Unlike stitches, the adhesive strips do not require needles or anesthetic medicine for placement. The strips usually fall off on their own as the wound is healing. It is important to take proper care of your wound at home while it heals. How to care for a sterile tape wound   Try to keep the area around your wound clean and dry. Do not allow the adhesive strips to get wet for the first 12 hours.  Do not use any soaps or ointments on the wound for the first 12 hours.  If a bandage (dressing) has been applied, keep it dry.  Follow instructions from your health care provider about how often to change the dressing. ? Wash your hands with soap and water before you change your dressing. If soap and water are not available, use hand sanitizer. ? Change your dressing as told by your health care provider. ? Leave adhesive strips in place. These skin closures may need to stay in place for 2 weeks or longer. If adhesive strip edges start to loosen and curl up, you may trim the loose edges. Do not remove adhesive strips completely unless your health care provider tells you to do that.  Do not scratch, rub, or pick at the wound area.  Protect the wound from further injury until it is healed.  Protect the wound from sun and tanning bed exposure while it is healing, and for several weeks after healing.  Check the wound every day for signs of infection. Check for: ? More redness, swelling, or pain. ? More fluid or blood. ? Warmth. ? Pus or a bad smell. Follow these instructions at home:  Take over-the-counter and prescription medicines  only as told by your health care provider.  Keep all follow-up visits as told by your health care provider. This is important. Contact a health care provider if:  Your adhesive strips become soaked with blood or fall off before the wound has healed. The tape will need to be replaced.  You have a fever. Get help right away if:  You have chills.  You develop a rash after the strips are applied.  You have a red streak that goes away from the wound.  You have more redness, swelling, or pain around your wound.  You have more fluid or blood coming from your wound.  Your wound feels warm to the touch.  You have pus or a bad smell coming from your wound.  Your wound breaks open. This information is not intended to replace advice given to you by your health care provider. Make sure you discuss any questions you have with your health care provider. Document Revised: 03/10/2017 Document Reviewed: 02/19/2016 Elsevier Patient Education  [place] Sutures are stitches that can be used to close wounds. Taking care of your wound properly can help to prevent pain and infection. It can also help your wound to heal more quickly. Follow instructions from your health care provider about how to care for your sutured wound. Supplies needed:  Soap and water.  A clean bandage (dressing), if needed.  Antibiotic ointment.  A  clean towel. How to care for your sutured wound   Keep the wound completely dry for the first 24 hours, or for as long as directed by your health care provider. After 24-48 hours, you may shower or bathe as directed by your health care provider. Do not soak or submerge the wound in water until the sutures have been removed.  After the first 24 hours, clean the wound once a day, or as often as directed by your health care provider, using the following steps: ? Wash the wound with soap and water. ? Rinse the wound with water to remove all  soap. ? Pat the wound dry with a clean towel. Do not rub the wound.  After cleaning the wound, apply a thin layer of antibiotic ointment as directed by your health care provider. This will prevent infection and keep the dressing from sticking to the wound.  Follow instructions from your health care provider about how to change your dressing: ? Wash your hands with soap and water. If soap and water are not available, use hand sanitizer. ? Change your dressing at least once a day, or as often as told by your health care provider. If your dressing gets wet or dirty, change it. ? Leave sutures and other skin closures, such as adhesive tape or skin glue, in place. These skin closures may need to stay in place for 2 weeks or longer. If adhesive strip edges start to loosen and curl up, you may trim the loose edges. Do not remove adhesive strips completely unless your health care provider tells you to do that.  Check your wound every day for signs of infection. Watch for: ? Redness, swelling, or pain. ? Fluid or blood. ? Warmth. ? Pus or a bad smell.  Have the sutures removed as directed by your health care provider. Follow these instructions at home: Medicines  Take or apply over-the-counter and prescription medicines only as told by your health care provider.  If you were prescribed an antibiotic medicine or ointment, take or apply it as told by your health care provider. Do not stop using the antibiotic even if your condition improves. General instructions  To help reduce scarring after your wound heals, cover your wound with clothing or apply sunscreen of at least 30 SPF whenever you are outside.  Do not scratch or pick at your wound.  Avoid stretching your wound.  Raise (elevate) the injured area above the level of your heart while you are sitting or lying down, if possible.  Drink enough fluids to keep your urine clear or pale yellow.  Keep all follow-up visits as told by your health  care provider. This is important. Contact a health care provider if:  You received a tetanus shot and you have swelling, severe pain, redness, or bleeding at the injection site.  Your wound breaks open.  You have redness, swelling, or pain around your wound.  You have fluid or blood coming from your wound.  Your wound feels warm to the touch.  You have a fever.  You notice something coming out of your wound, such as wood or glass.  You have pain that does not get better with medicine.  The skin near your wound changes color.  You need to change your dressing very frequently due to a lot of fluid, blood, or pus draining from the wound.  You develop a new rash.  You develop numbness around the wound. Get help right away if:  You develop  severe swelling around your wound.  You have pus or a bad smell coming from your wound.  Your pain suddenly gets worse and is severe.  You develop painful lumps near your wound or anywhere on your body.  You have a red streak going away from your wound.  The wound is on your hand or foot and: ? You cannot properly move a finger or toe. ? Your fingers or toes look pale or bluish. ? You have numbness that is spreading down your hand, foot, fingers, or toes. Summary  Sutures are stitches that can be used to close wounds.  Taking care of your wound properly can help to prevent pain and infection.  Keep the wound completely dry for the first 24 hours, or for as long as directed by your health care provider. After 24-48 hours, you may shower or bathe as directed by your health care provider. This information is not intended to replace advice given to you by your health care provider. Make sure you discuss any questions you have with your health care provider. Document Revised: 03/10/2017 Document Reviewed: 05/03/2016 Elsevier Patient Education  2020 [employer].

## 2019-06-14 ENCOUNTER — Other Ambulatory Visit: Payer: Self-pay

## 2019-06-14 ENCOUNTER — Ambulatory Visit (INDEPENDENT_AMBULATORY_CARE_PROVIDER_SITE_OTHER): Payer: Managed Care, Other (non HMO) | Admitting: Nurse Practitioner

## 2019-06-14 ENCOUNTER — Encounter: Payer: Self-pay | Admitting: Nurse Practitioner

## 2019-06-14 VITALS — BP 144/89 | HR 67 | Temp 98.0°F | Ht 69.0 in | Wt 226.0 lb

## 2019-06-14 DIAGNOSIS — N529 Male erectile dysfunction, unspecified: Secondary | ICD-10-CM | POA: Insufficient documentation

## 2019-06-14 DIAGNOSIS — Z4802 Encounter for removal of sutures: Secondary | ICD-10-CM | POA: Diagnosis not present

## 2019-06-14 DIAGNOSIS — M2550 Pain in unspecified joint: Secondary | ICD-10-CM | POA: Insufficient documentation

## 2019-06-14 DIAGNOSIS — J309 Allergic rhinitis, unspecified: Secondary | ICD-10-CM | POA: Insufficient documentation

## 2019-06-14 DIAGNOSIS — K219 Gastro-esophageal reflux disease without esophagitis: Secondary | ICD-10-CM | POA: Insufficient documentation

## 2019-06-14 NOTE — Patient Instructions (Signed)
Suture Removal, Care After This sheet gives you information about how to care for yourself after your procedure. Your health care provider may also give you more specific instructions. If you have problems or questions, contact your health care provider. What can I expect after the procedure? After your stitches (sutures) are removed, it is common to have:  Some discomfort and swelling in the area.  Slight redness in the area. Follow these instructions at home: If you have a bandage:  Wash your hands with soap and water before you change your bandage (dressing). If soap and water are not available, use hand sanitizer.  Change your dressing as told by your health care provider. If your dressing becomes wet or dirty, or develops a bad smell, change it as soon as possible.  If your dressing sticks to your skin, soak it in warm water to loosen it. Wound care   Check your wound every day for signs of infection. Check for: ? More redness, swelling, or pain. ? Fluid or blood. ? Warmth. ? Pus or a bad smell.  Wash your hands with soap and water before and after touching your wound.  Apply cream or ointment only as directed by your health care provider. If you are using cream or ointment, wash the area with soap and water 2 times a day to remove all the cream or ointment. Rinse off the soap and pat the area dry with a clean towel.  If you have skin glue or adhesive strips on your wound, leave these closures in place. They may need to stay in place for 2 weeks or longer. If adhesive strip edges start to loosen and curl up, you may trim the loose edges. Do not remove adhesive strips completely unless your health care provider tells you to do that.  Keep the wound area dry and clean. Do not take baths, swim, or use a hot tub until your health care provider approves.  Continue to protect the wound from injury.  Do not pick at your wound. Picking can cause an infection.  When your wound has  completely healed, wear sunscreen over it or cover it with clothing when you are outside. New scars get sunburned easily, which can make scarring worse. General instructions  Take over-the-counter and prescription medicines only as told by your health care provider.  Keep all follow-up visits as told by your health care provider. This is important. Contact a health care provider if:  You have redness, swelling, or pain around your wound.  You have fluid or blood coming from your wound.  Your wound feels warm to the touch.  You have pus or a bad smell coming from your wound.  Your wound opens up. Get help right away if:  You have a fever.  You have redness that is spreading from your wound. Summary  After your sutures are removed, it is common to have some discomfort and swelling in the area.  Wash your hands with soap and water before you change your bandage (dressing).  Keep the wound area dry and clean. Do not take baths, swim, or use a hot tub until your health care provider approves. This information is not intended to replace advice given to you by your health care provider. Make sure you discuss any questions you have with your health care provider. Document Revised: 03/10/2017 Document Reviewed: 05/03/2016 Elsevier Patient Education  2020 Elsevier Inc.  

## 2019-06-14 NOTE — Progress Notes (Signed)
Acute Office Visit  Subjective:    Patient ID: James Alexander, male    DOB: 10/25/1955, 64 y.o.   MRN: [id number]  Chief Complaint  Patient presents with  . Suture / Staple Removal    HPI Suture Removal Patient here for suture removal. He obtained an incision for cyst removal  1 week ago, which required closure with 2 sutures. He denies pain, redness, or drainage from the wound. His last tetanus was 2 weeks ago.   Past Medical History:  Diagnosis Date  . High blood pressure     History reviewed. No pertinent surgical history.  Family History  Problem Relation Age of Onset  . Liver cancer Mother   . Heart attack Father   . High blood pressure Father     Social History   Socioeconomic History  . Marital status: Married    Spouse name: Saul Lawery  . Number of children: 2  . Years of education: Not on file  . Highest education level: Not on file  Occupational History  . Occupation: TRUCK DRIVER    Employer: [employer]  Tobacco Use  . Smoking status: Current Every Day Smoker  . Smokeless tobacco: Never Used  . Tobacco comment: 30+ pack years, has cut back to 1 ppd  Substance and Sexual Activity  . Alcohol use: Yes  . Drug use: No  . Sexual activity: Never    Partners: Female    Birth control/protection: None  Other Topics Concern  . Not on file  Social History Narrative  . Not on file   Social Determinants of Health   Financial Resource Strain:   . Difficulty of Paying Living Expenses: Not on file  Food Insecurity:   . Worried About Charity fundraiser in the Last Year: Not on file  . Ran Out of Food in the Last Year: Not on file  Transportation Needs:   . Lack of Transportation (Medical): Not on file  . Lack of Transportation (Non-Medical): Not on file  Physical Activity:   . Days of Exercise per Week: Not on file  . Minutes of Exercise per Session: Not on file  Stress:   . Feeling of Stress : Not on file  Social Connections:   . Frequency of  Communication with Friends and Family: Not on file  . Frequency of Social Gatherings with Friends and Family: Not on file  . Attends Religious Services: Not on file  . Active Member of Clubs or Organizations: Not on file  . Attends Archivist Meetings: Not on file  . Marital Status: Not on file  Intimate Partner Violence:   . Fear of Current or Ex-Partner: Not on file  . Emotionally Abused: Not on file  . Physically Abused: Not on file  . Sexually Abused: Not on file    Outpatient Medications Prior to Visit  Medication Sig Dispense Refill  . amlodipine-atorvastatin (CADUET) 10-20 MG tablet Take 1 tablet by mouth daily. 90 tablet 3  . cetirizine (ZYRTEC) 10 MG tablet Take 1 tablet (10 mg total) by mouth 2 (two) times daily. 180 tablet 1  . guaiFENesin (MUCINEX) 600 MG 12 hr tablet Take 1-2 tablets (600-1,200 mg total) by mouth 2 (two) times daily. 360 tablet 1  . hydrochlorothiazide (HYDRODIURIL) 25 MG tablet Take 1 tablet (25 mg total) by mouth daily. 90 tablet 3   No facility-administered medications prior to visit.    Allergies  Allergen Reactions  . Penicillins Shortness Of Breath and Swelling  Review of Systems  Constitutional: Negative for chills and fever.  Skin: Negative for color change, pallor and rash.  Hematological: Does not bruise/bleed easily.       Objective:    Physical Exam Constitutional:      Appearance: Normal appearance.  HENT:     Head: Normocephalic and atraumatic.  Cardiovascular:     Rate and Rhythm: Normal rate and regular rhythm.     Pulses: Normal pulses.  Pulmonary:     Effort: Pulmonary effort is normal.  Musculoskeletal:        General: Normal range of motion.     Cervical back: Normal range of motion.  Skin:    General: Skin is warm and dry.     Capillary Refill: Capillary refill takes less than 2 seconds.       Neurological:     General: No focal deficit present.     Mental Status: He is alert and oriented to  person, place, and time. Mental status is at baseline.  Psychiatric:        Mood and Affect: Mood normal.        Behavior: Behavior normal.        Thought Content: Thought content normal.        Judgment: Judgment normal.    BP (!) 144/89   Pulse 67   Temp 98 F (36.7 C) (Oral)   Ht 5\' 9"  (1.753 m)   Wt 226 lb 0.6 oz (102.5 kg)   SpO2 97%   BMI 33.38 kg/m  Wt Readings from Last 3 Encounters:  06/14/19 226 lb 0.6 oz (102.5 kg)  06/07/19 225 lb 0.6 oz (102.1 kg)  05/24/19 224 lb 0.6 oz (101.6 kg)    Health Maintenance Due  Topic Date Due  . COLONOSCOPY  08/11/2006    There are no preventive care reminders to display for this patient.   No results found for: TSH Lab Results  Component Value Date   WBC 9.2 11/21/2018   HGB 17.8 (H) 11/21/2018   HCT 53.1 (H) 11/21/2018   MCV 80.9 11/21/2018   PLT 213 11/21/2018   Lab Results  Component Value Date   NA 137 11/21/2018   K 4.5 11/21/2018   CO2 27 11/21/2018   GLUCOSE 95 11/21/2018   BUN 13 11/21/2018   CREATININE 1.05 11/21/2018   BILITOT 0.6 11/21/2018   AST 18 11/21/2018   ALT 20 11/21/2018   PROT 7.5 11/21/2018   CALCIUM 9.8 11/21/2018   Lab Results  Component Value Date   CHOL 188 11/21/2018   Lab Results  Component Value Date   HDL 50 11/21/2018   Lab Results  Component Value Date   LDLCALC 112 (H) 11/21/2018   Lab Results  Component Value Date   TRIG 148 11/21/2018   Lab Results  Component Value Date   CHOLHDL 3.8 11/21/2018   No results found for: HGBA1C     Assessment & Plan:   1. Visit for suture removal Well approximated, well healed wound. 2 sutures removed without incident. Area cleansed with chlorhexidine and band aid placed.  Patient provided with home care instructions and all questions answered.    Orma Render, NP

## 2019-06-21 ENCOUNTER — Ambulatory Visit: Payer: Managed Care, Other (non HMO) | Attending: Internal Medicine

## 2019-06-21 DIAGNOSIS — Z23 Encounter for immunization: Secondary | ICD-10-CM

## 2019-06-21 NOTE — Progress Notes (Signed)
   Covid-19 Vaccination Clinic  Name:  James Alexander    MRN: [id number] DOB: 24-Jun-1955  06/21/2019  James Alexander was observed post Covid-19 immunization for 30 minutes based on pre-vaccination screening without incident. He was provided with Vaccine Information Sheet and instruction to access the V-Safe system.   James Alexander was instructed to call 911 with any severe reactions post vaccine: [name] Kitchen Difficulty breathing  . Swelling of face and throat  . A fast heartbeat  . A bad rash all over body  . Dizziness and weakness   Immunizations Administered    Name Date Dose VIS Date Route   Pfizer COVID-19 Vaccine 06/21/2019  9:49 AM 0.3 mL 03/22/2019 Intramuscular   Manufacturer: [place]   Lot: [id number]   [place]: [id number]

## 2019-07-15 ENCOUNTER — Ambulatory Visit: Payer: Managed Care, Other (non HMO)

## 2019-07-17 ENCOUNTER — Ambulatory Visit: Payer: Managed Care, Other (non HMO)

## 2019-07-17 ENCOUNTER — Ambulatory Visit: Payer: Managed Care, Other (non HMO) | Attending: Internal Medicine

## 2019-07-17 DIAGNOSIS — Z23 Encounter for immunization: Secondary | ICD-10-CM

## 2019-07-17 NOTE — Progress Notes (Signed)
   Covid-19 Vaccination Clinic  Name:  James Alexander    MRN: [id number] DOB: 01-17-1956  07/17/2019  [name] was observed post Covid-19 immunization for 30 minutes based on pre-vaccination screening without incident. He was provided with Vaccine Information Sheet and instruction to access the V-Safe system.   [name] was instructed to call 911 with any severe reactions post vaccine: [name] Kitchen Difficulty breathing  . Swelling of face and throat  . A fast heartbeat  . A bad rash all over body  . Dizziness and weakness   Immunizations Administered    Name Date Dose VIS Date Route   Pfizer COVID-19 Vaccine 07/17/2019 12:34 PM 0.3 mL 03/22/2019 Intramuscular   Manufacturer: [place]   Lot: [id number]   [place]: [id number]

## 2019-11-29 ENCOUNTER — Encounter: Payer: Self-pay | Admitting: Osteopathic Medicine

## 2019-11-29 ENCOUNTER — Other Ambulatory Visit: Payer: Self-pay

## 2019-11-29 ENCOUNTER — Ambulatory Visit: Payer: Managed Care, Other (non HMO) | Admitting: Osteopathic Medicine

## 2019-11-29 VITALS — BP 133/81 | HR 70 | Wt 224.0 lb

## 2019-11-29 DIAGNOSIS — I10 Essential (primary) hypertension: Secondary | ICD-10-CM

## 2019-11-29 DIAGNOSIS — F17200 Nicotine dependence, unspecified, uncomplicated: Secondary | ICD-10-CM | POA: Diagnosis not present

## 2019-11-29 DIAGNOSIS — K219 Gastro-esophageal reflux disease without esophagitis: Secondary | ICD-10-CM | POA: Diagnosis not present

## 2019-11-29 DIAGNOSIS — Z0000 Encounter for general adult medical examination without abnormal findings: Secondary | ICD-10-CM

## 2019-11-29 NOTE — Patient Instructions (Signed)
General Preventive Care  Most recent routine screening labs: ordered.   Blood pressure goal 130/80 or less.   Tobacco: don't! Please let me know if you need help quitting!  Alcohol: responsible moderation is ok for most adults - if you have concerns about your alcohol intake, please talk to me!   Exercise: as tolerated to reduce risk of cardiovascular disease and diabetes. Strength training will also prevent osteoporosis.   Mental health: if need for mental health care (medicines, counseling, other), or concerns about moods, please let me know!   Sexual / Reproductive health: if need for STD testing, or if concerns with libido/pain problems, please let me know!  Advanced Directive: Living Will and/or Healthcare Power of Attorney recommended for all adults, regardless of age or health.  Vaccines  Flu vaccine: every fall.   Shingles vaccine: after age 76.   Pneumonia vaccines: after age 64, sooner for smokers   Tetanus booster: every 10 years - done 05/24/2019  COVID vaccine: THANKS for getting your vaccine! :)  Cancer screenings   Colon cancer screening: for everyone age 73-75. Colonoscopy available for all, many people also qualify for the Cologuard stool test   Prostate cancer screening: PSA blood test age 66-71  Lung cancer screening: CT chest every year for those aged 63 to 31 years who have a 20 pack-year smoking history and currently smoke or have quit within the past 15 years  Infection screenings  . HIV: recommended screening at least once age 50-65, DONE . Gonorrhea/Chlamydia: screening as needed . Hepatitis C: recommended once for everyone age 50-75 - DONE . TB: certain at-risk populations, or depending on work requirements and/or travel history Other . Bone Density Test: recommended for men at age 80, . Abdominal Aortic Aneurysm: screening with ultrasound recommended once for men age 69-75 who have ever smoked

## 2019-11-29 NOTE — Progress Notes (Signed)
James Alexander is a 64 y.o. male who presents to  [place] at [facility]  today, 11/29/19, seeking care for the following:  . Annual / Preventive care      ASSESSMENT & PLAN with other pertinent findings:  The primary encounter diagnosis was Annual physical exam. Diagnoses of Essential hypertension, Gastroesophageal reflux disease, unspecified whether esophagitis present, and Smoker were also pertinent to this visit.   No results found for this or any previous visit (from the past 24 hour(s)).  Wt Readings from Last 3 Encounters:  11/29/19 224 lb (101.6 kg)  06/14/19 226 lb 0.6 oz (102.5 kg)  06/07/19 225 lb 0.6 oz (102.1 kg)     Patient Instructions  General Preventive Care  Most recent routine screening labs: ordered.   Blood pressure goal 130/80 or less.   Tobacco: don't! Please let me know if you need help quitting!  Alcohol: responsible moderation is ok for most adults - if you have concerns about your alcohol intake, please talk to me!   Exercise: as tolerated to reduce risk of cardiovascular disease and diabetes. Strength training will also prevent osteoporosis.   Mental health: if need for mental health care (medicines, counseling, other), or concerns about moods, please let me know!   Sexual / Reproductive health: if need for STD testing, or if concerns with libido/pain problems, please let me know!  Advanced Directive: Living Will and/or Healthcare Power of Attorney recommended for all adults, regardless of age or health.  Vaccines  Flu vaccine: every fall.   Shingles vaccine: after age 8.   Pneumonia vaccines: after age 34, sooner for smokers   Tetanus booster: every 10 years - done 05/24/2019  COVID vaccine: THANKS for getting your vaccine! :)  Cancer screenings   Colon cancer screening: for everyone age 35-75. Colonoscopy available for all, many people also qualify for the Cologuard stool test   Prostate  cancer screening: PSA blood test age 94-71  Lung cancer screening: CT chest every year for those aged 34 to 59 years who have a 20 pack-year smoking history and currently smoke or have quit within the past 15 years  Infection screenings  . HIV: recommended screening at least once age 90-65, DONE . Gonorrhea/Chlamydia: screening as needed . Hepatitis C: recommended once for everyone age 49-75 - DONE . TB: certain at-risk populations, or depending on work requirements and/or travel history Other . Bone Density Test: recommended for men at age 7, . Abdominal Aortic Aneurysm: screening with ultrasound recommended once for men age 63-75 who have ever smoked    Orders Placed This Encounter  Procedures  . CT CHEST LUNG CA SCREEN LOW DOSE W/O CM  . CBC  . COMPLETE METABOLIC PANEL WITH GFR  . Lipid panel  . PSA, Total with Reflex to PSA, Free    No orders of the defined types were placed in this encounter.      Follow-up instructions: Return in about 6 months (around 05/31/2020) for ROUTINE CHECK-UP, MONITOR BP. SEE ME SOONER IF NEEDED.                                         BP 133/81   Pulse 70   Wt 224 lb (101.6 kg)   SpO2 95%   BMI 33.08 kg/m   Current Meds  Medication Sig  . amlodipine-atorvastatin (CADUET) 10-20 MG  tablet Take 1 tablet by mouth daily.  . cetirizine (ZYRTEC) 10 MG tablet Take 1 tablet (10 mg total) by mouth 2 (two) times daily.  . hydrochlorothiazide (HYDRODIURIL) 25 MG tablet Take 1 tablet (25 mg total) by mouth daily.    No results found for this or any previous visit (from the past 72 hour(s)).  No results found.     All questions at time of visit were answered - patient instructed to contact office with any additional concerns or updates.  ER/RTC precautions were reviewed with the patient as applicable.   Please note: voice recognition software was used to produce this document, and typos may escape review.  Please contact Dr. Sheppard Coil for any needed clarifications.

## 2019-12-02 LAB — LIPID PANEL
Cholesterol: 137 mg/dL (ref <200)
HDL: 55 mg/dL (ref >=40)
LDL Cholesterol (Calc): 66 mg/dL (calc)
Non-HDL Cholesterol (Calc): 82 mg/dL (calc) (ref <130)
Total CHOL/HDL Ratio: 2.5 (calc) (ref <5.0)
Triglycerides: 81 mg/dL (ref <150)

## 2019-12-02 LAB — COMPLETE METABOLIC PANEL WITH GFR
AG Ratio: 1.6 (calc) (ref 1.0–2.5)
ALT: 20 U/L (ref 9–46)
AST: 18 U/L (ref 10–35)
Albumin: 4.6 g/dL (ref 3.6–5.1)
Alkaline phosphatase (APISO): 114 U/L (ref 35–144)
BUN: 13 mg/dL (ref 7–25)
CO2: 28 mmol/L (ref 20–32)
Calcium: 9.4 mg/dL (ref 8.6–10.3)
Chloride: 99 mmol/L (ref 98–110)
Creat: 0.93 mg/dL (ref 0.70–1.25)
GFR, Est African American: 101 mL/min/{1.73_m2} (ref >=60)
GFR, Est Non African American: 87 mL/min/{1.73_m2} (ref >=60)
Globulin: 2.8 g/dL (calc) (ref 1.9–3.7)
Glucose, Bld: 89 mg/dL (ref 65–139)
Potassium: 4.3 mmol/L (ref 3.5–5.3)
Sodium: 137 mmol/L (ref 135–146)
Total Bilirubin: 0.6 mg/dL (ref 0.2–1.2)
Total Protein: 7.4 g/dL (ref 6.1–8.1)

## 2019-12-02 LAB — CBC
HCT: 52.9 % — ABNORMAL HIGH (ref 38.5–50.0)
Hemoglobin: 17.6 g/dL — ABNORMAL HIGH (ref 13.2–17.1)
MCH: 27 pg (ref 27.0–33.0)
MCHC: 33.3 g/dL (ref 32.0–36.0)
MCV: 81 fL (ref 80.0–100.0)
MPV: 10.9 fL (ref 7.5–12.5)
Platelets: 205 10*3/uL (ref 140–400)
RBC: 6.53 10*6/uL — ABNORMAL HIGH (ref 4.20–5.80)
RDW: 13.3 % (ref 11.0–15.0)
WBC: 8.9 10*3/uL (ref 3.8–10.8)

## 2019-12-02 LAB — PSA, TOTAL WITH REFLEX TO PSA, FREE: PSA, Total: 0.1 ng/mL (ref <=4.0)

## 2019-12-04 ENCOUNTER — Telehealth: Payer: Self-pay

## 2019-12-04 NOTE — Telephone Encounter (Signed)
Nope, nothing has come to me about this. Apolonio Schneiders, do you know anything?

## 2019-12-04 NOTE — Telephone Encounter (Signed)
Patient called into office in reference to CT lung screening order that was put in on 11/29/2019, his insurance has sent him a letter stating additional information is needed. Have you seen anything on this patient needing additional information being sent in for preventive screening?

## 2019-12-04 NOTE — Telephone Encounter (Signed)
I checked with insurance today, this case is still pending.

## 2019-12-05 NOTE — Telephone Encounter (Signed)
Case approved. Imaging will be calling patient to schedule

## 2019-12-05 NOTE — Telephone Encounter (Signed)
Patient made aware of approval. 

## 2020-03-12 ENCOUNTER — Ambulatory Visit (INDEPENDENT_AMBULATORY_CARE_PROVIDER_SITE_OTHER): Payer: Managed Care, Other (non HMO)

## 2020-03-12 ENCOUNTER — Other Ambulatory Visit: Payer: Self-pay

## 2020-03-12 DIAGNOSIS — Z122 Encounter for screening for malignant neoplasm of respiratory organs: Secondary | ICD-10-CM | POA: Diagnosis not present

## 2020-03-12 DIAGNOSIS — F17200 Nicotine dependence, unspecified, uncomplicated: Secondary | ICD-10-CM | POA: Diagnosis not present

## 2020-05-29 ENCOUNTER — Other Ambulatory Visit: Payer: Self-pay

## 2020-05-29 ENCOUNTER — Encounter: Payer: Self-pay | Admitting: Osteopathic Medicine

## 2020-05-29 ENCOUNTER — Ambulatory Visit: Payer: Managed Care, Other (non HMO) | Admitting: Osteopathic Medicine

## 2020-05-29 VITALS — BP 135/90 | HR 73 | Temp 98.1°F | Resp 20 | Ht 69.0 in | Wt 225.0 lb

## 2020-05-29 DIAGNOSIS — R053 Chronic cough: Secondary | ICD-10-CM

## 2020-05-29 DIAGNOSIS — J410 Simple chronic bronchitis: Secondary | ICD-10-CM | POA: Diagnosis not present

## 2020-05-29 DIAGNOSIS — K219 Gastro-esophageal reflux disease without esophagitis: Secondary | ICD-10-CM

## 2020-05-29 DIAGNOSIS — I10 Essential (primary) hypertension: Secondary | ICD-10-CM

## 2020-05-29 DIAGNOSIS — R0981 Nasal congestion: Secondary | ICD-10-CM

## 2020-05-29 DIAGNOSIS — F17200 Nicotine dependence, unspecified, uncomplicated: Secondary | ICD-10-CM

## 2020-05-29 MED ORDER — PANTOPRAZOLE SODIUM 40 MG PO TBEC: 40.0000 mg | DELAYED_RELEASE_TABLET | Freq: Every day | ORAL | 0 refills | Status: DC

## 2020-05-29 NOTE — Patient Instructions (Signed)
Plan: OK for Mucinex-D as needed May think about adding Flonase/Nasonex spray in addition to Zyrtec Will try Pantoprazole (Protonix) for acid reflux for 2-3 months then stop or transition to Famotidine (Pepcid) for long-term use if desired

## 2020-05-29 NOTE — Progress Notes (Signed)
James Alexander is a 65 y.o. male who presents to  [place] at [facility]  today, 05/29/20, seeking care for the following:  . BP check . Coughing intermittently -still smoking, concerned about sinus drainage, does note that it is worse at night with lying flat or lying on his left side. . 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 Diagnoses of Smoker, Gastroesophageal reflux disease without esophagitis, Smokers' cough ([place]), Chronic cough, and Sinus congestion were also pertinent to this visit.   1. Essential hypertension Blood pressure at goal Patient requested specific referral for a certain location for sleep study if needed, I am okay to do this if required by DOT physical physician/provider  2. Smoker 3. Gastroesophageal reflux disease without esophagitis 4. Smokers' cough ([place]) 5. Chronic cough 6. Sinus congestion All probably interrelated.  Will trial PPI for 2 to 3 months, think about transitioning to H2 blocker if this is helpful.  Would also add Mucinex D as needed which has helped before, would recommend adding steroid nasal spray as allergy season approaches in addition to his usual antihistamine.    Patient Instructions  Plan: OK for Mucinex-D as needed May think about adding Flonase/Nasonex spray in addition to Zyrtec Will try Pantoprazole (Protonix) for acid reflux for 2-3 months then stop or transition to Famotidine (Pepcid) for long-term use if desired    No orders of the defined types were  placed in this encounter.   Meds ordered this encounter  Medications  . pantoprazole (PROTONIX) 40 MG tablet    Sig: Take 1 tablet (40 mg total) by mouth daily.    Dispense:  90 tablet    Refill:  0     See below for relevant physical exam findings  See below for recent lab and imaging results reviewed  Medications, allergies, PMH, PSH, SocH, FamH reviewed below    Follow-up instructions: Return in about 6 months (around 11/26/2020) for ANNUAL CHECK-UP - SEE [place] SOONER IF NEEDED.                                        Exam:  BP 135/90 (BP Location: Right Arm, Cuff Size: Normal)   Pulse 73   Temp 98.1 F (36.7 C) (Oral)   Resp 20   Ht 5\' 9"  (1.753 m)   Wt 225 lb (102.1 kg)   SpO2 95%   BMI 33.23 kg/m   Constitutional: VS see above. General Appearance: alert, well-developed, well-nourished, NAD  Neck: No masses, trachea midline.   Respiratory: Normal respiratory effort.  Diffuse wheeze, no rhonchi, no rales  Cardiovascular: S1/S2 normal, no murmur, no rub/gallop auscultated. RRR.   Musculoskeletal: Gait normal. Symmetric and independent movement of all extremities  Abdominal: non-tender, non-distended, no appreciable organomegaly, neg Murphy's, BS WNLx4  Neurological: Normal balance/coordination. No tremor.  Skin: warm, dry, intact.   Psychiatric: Normal judgment/insight. Normal mood and affect. Oriented  x3.   Current Meds  Medication Sig  . amlodipine-atorvastatin (CADUET) 10-20 MG tablet Take 1 tablet by mouth daily.  . cetirizine (ZYRTEC) 10 MG tablet Take 1 tablet (10 mg total) by mouth 2 (two) times daily.  . hydrochlorothiazide (HYDRODIURIL) 25 MG tablet Take 1 tablet (25 mg total) by mouth daily.  . pantoprazole (PROTONIX) 40 MG tablet Take 1 tablet (40 mg total) by mouth daily.    Allergies  Allergen Reactions  . Penicillins Shortness Of Breath and Swelling    Patient Active Problem List   Diagnosis Date Noted   . Allergic rhinitis 06/14/2019  . Arthralgia 06/14/2019  . Erectile dysfunction 06/14/2019  . GERD (gastroesophageal reflux disease) 06/14/2019  . Internal hemorrhoid 11/21/2018  . Diverticulosis 11/21/2018  . Adenomatous polyp of colon 11/21/2018  . Hypertension 05/30/2017  . Hypotestosteronism 05/30/2017  . Dyspnea on exertion 02/08/2017  . Tobacco use 07/19/2016  . Abnormal glucose 06/25/2014  . Atopic dermatitis 12/06/2013  . Psoriasis 12/06/2013  . Headache 08/03/2012  . Plantar fasciitis 09/16/2011  . Anxiety 05/30/2011  . Renal insufficiency 05/30/2011    Family History  Problem Relation Age of Onset  . Liver cancer Mother   . Heart attack Father   . High blood pressure Father     Social History   Tobacco Use  Smoking Status Current Every Day Smoker  Smokeless Tobacco Never Used  Tobacco Comment   30+ pack years, has cut back to 1 ppd    No past surgical history on file.  Immunization History  Administered Date(s) Administered  . Influenza Split 01/22/2010  . Influenza, Seasonal, Injecte, Preservative Fre 03/12/2014, 03/18/2015  . Influenza,inj,Quad PF,6+ Mos 05/30/2017, 05/24/2019  . Influenza-Unspecified 03/04/2011, 04/20/2012, 02/22/2013  . PFIZER(Purple Top)SARS-COV-2 Vaccination 06/21/2019, 07/17/2019, 01/30/2020  . Tdap 05/24/2019  . Zoster Recombinat (Shingrix) 02/28/2018, 05/23/2018    No results found for this or any previous visit (from the past 2160 hour(s)).  No results found.     All questions at time of visit were answered - patient instructed to contact office with any additional concerns or updates. ER/RTC precautions were reviewed with the patient as applicable.   Please note: manual typing as well as voice recognition software may have been used to produce this document - typos may escape review. Please contact Dr. Sheppard Coil for any needed clarifications.

## 2020-06-19 ENCOUNTER — Other Ambulatory Visit: Payer: Self-pay

## 2020-06-19 MED ORDER — HYDROCHLOROTHIAZIDE 25 MG PO TABS
25.0000 mg | ORAL_TABLET | Freq: Every day | ORAL | 0 refills | Status: DC
Start: 2020-06-19 — End: 2020-09-24

## 2020-06-19 MED ORDER — AMLODIPINE-ATORVASTATIN 10-20 MG PO TABS: 1.0000 | ORAL_TABLET | Freq: Every day | ORAL | 0 refills | Status: DC

## 2020-08-21 ENCOUNTER — Other Ambulatory Visit: Payer: Self-pay | Admitting: Osteopathic Medicine

## 2020-09-10 ENCOUNTER — Other Ambulatory Visit: Payer: Self-pay | Admitting: Osteopathic Medicine

## 2020-09-24 ENCOUNTER — Other Ambulatory Visit: Payer: Self-pay | Admitting: Osteopathic Medicine

## 2020-11-13 ENCOUNTER — Other Ambulatory Visit: Payer: Self-pay | Admitting: Osteopathic Medicine

## 2020-11-26 ENCOUNTER — Encounter: Payer: Self-pay | Admitting: Osteopathic Medicine

## 2020-11-26 ENCOUNTER — Other Ambulatory Visit: Payer: Self-pay

## 2020-11-26 ENCOUNTER — Telehealth: Payer: Self-pay

## 2020-11-26 ENCOUNTER — Ambulatory Visit (INDEPENDENT_AMBULATORY_CARE_PROVIDER_SITE_OTHER): Payer: Managed Care, Other (non HMO) | Admitting: Osteopathic Medicine

## 2020-11-26 VITALS — BP 147/83 | HR 74 | Ht 69.0 in | Wt 221.0 lb

## 2020-11-26 DIAGNOSIS — Z0000 Encounter for general adult medical examination without abnormal findings: Secondary | ICD-10-CM

## 2020-11-26 DIAGNOSIS — I10 Essential (primary) hypertension: Secondary | ICD-10-CM

## 2020-11-26 DIAGNOSIS — R053 Chronic cough: Secondary | ICD-10-CM

## 2020-11-26 DIAGNOSIS — Z720 Tobacco use: Secondary | ICD-10-CM | POA: Diagnosis not present

## 2020-11-26 MED ORDER — HYDROCHLOROTHIAZIDE 25 MG PO TABS: 25.0000 mg | ORAL_TABLET | Freq: Every day | ORAL | 3 refills | Status: DC

## 2020-11-26 MED ORDER — CLONAZEPAM 0.5 MG PO TABS: 0.5000 mg | ORAL_TABLET | Freq: Three times a day (TID) | ORAL | 1 refills | Status: DC | PRN

## 2020-11-26 MED ORDER — CETIRIZINE HCL 10 MG PO TABS: 10.0000 mg | ORAL_TABLET | Freq: Every day | ORAL | 3 refills | Status: DC

## 2020-11-26 MED ORDER — AMLODIPINE-ATORVASTATIN 10-20 MG PO TABS: 1.0000 | ORAL_TABLET | Freq: Every day | ORAL | 3 refills | Status: DC

## 2020-11-26 NOTE — Progress Notes (Signed)
James Alexander is a 65 y.o. male who presents to  [place] at [facility]  today, 11/26/20, seeking care for the following:  Annual physical    BP Readings from Last 3 Encounters:  11/26/20 (!) 147/83  05/29/20 135/90  11/29/19 133/81    ASSESSMENT & PLAN with other pertinent findings:  The primary encounter diagnosis was Annual physical exam. Diagnoses of Essential hypertension, Chronic cough, and Tobacco use were also pertinent to this visit.   Discussed smoking cessation Discussed blood pressure being above goal, patient will monitor at home, discussed goal less than 130/80, may need to adjust medications if consistently higher than 140/90 Still having some smoker's cough and postnasal drip, will discontinue PPI as this does not seem to have made any difference in his cough.  Patient Instructions  General Preventive Care Most recent routine screening labs: ordered.  Blood pressure goal 130/80 or less.  Tobacco: don't! Please let me know if you need help quitting! Alcohol: responsible moderation is ok for most adults - if you have concerns about your alcohol intake, please talk to me!  Exercise: as tolerated to reduce risk of cardiovascular disease and diabetes. Strength training will also prevent osteoporosis.  Mental health: if need for mental health care (medicines, counseling, other), or concerns about moods, please let me know!  Sexual / Reproductive health: if need for STD testing, or if concerns with libido/pain problems, please let me know! Advanced Directive: Living Will and/or Healthcare Power of Attorney recommended for all adults, regardless of age or health.  Vaccines Flu vaccine: every fall.  Shingles vaccine: all done!   Pneumonia vaccines: after age 23, advised sooner for smokers  Tetanus booster: every 10 years - done 05/24/2019, due 2031 COVID vaccine: THANKS for getting your vaccine! :) FALL BOOSTER RECOMMENDED   Cancer screenings  Colon cancer screening: due 2030 Prostate cancer screening: PSA blood test age 39-71 Lung cancer screening: CT chest every year for those aged 27 to 6 years who have a 20 pack-year smoking history and currently smoke or have quit within the past 15 years  Infection screenings  HIV: recommended screening at least once age 60-65, DONE Gonorrhea/Chlamydia: screening as needed Hepatitis C: recommended once for everyone age 0000000 - DONE TB: certain at-risk populations, or depending on work requirements and/or travel history Other Bone Density Test: recommended for men at age 10 Abdominal Aortic Aneurysm: screening with ultrasound recommended once for men age 54-75 who have ever smoked   Orders Placed This Encounter  Procedures   CBC   COMPLETE METABOLIC PANEL WITH GFR   Lipid panel   PSA, Total with Reflex to PSA, Free     Meds ordered this encounter  Medications   amlodipine-atorvastatin (CADUET) 10-20 MG tablet    Sig: Take 1 tablet by mouth daily.    Dispense:  90 tablet    Refill:  3   cetirizine (ZYRTEC) 10 MG tablet    Sig: Take 1 tablet (10 mg total) by mouth daily.    Dispense:  90 tablet    Refill:  3   hydrochlorothiazide (HYDRODIURIL) 25 MG tablet    Sig: Take 1 tablet (25 mg total) by mouth daily.    Dispense:  90 tablet    Refill:  3    Immunization History  Administered Date(s) Administered   Influenza Split 01/22/2010   Influenza, Seasonal, Injecte, Preservative Fre 03/12/2014, 03/18/2015   Influenza,inj,Quad PF,6+ Mos 05/30/2017, 05/24/2019   Influenza-Unspecified 03/04/2011,  04/20/2012, 02/22/2013   PFIZER(Purple Top)SARS-COV-2 Vaccination 06/21/2019, 07/17/2019, 01/30/2020   Tdap 05/24/2019   Zoster Recombinat (Shingrix) 02/28/2018, 05/23/2018      See below for relevant physical exam findings  See below for recent lab and imaging results reviewed  Medications, allergies, PMH, PSH, SocH, [place] reviewed below    Follow-up  instructions: Return in about 6 months (around 05/29/2021) for CHECK-UP CHRONIC CONDITIONS, OUR FRONT DESK WILL CALL YOU TO SCHEDULE.                                        Exam:  BP (!) 147/83   Pulse 74   Ht '5\' 9"'$  (1.753 m)   Wt 221 lb (100.2 kg)   BMI 32.64 kg/m  Constitutional: VS see above. General Appearance: alert, well-developed, well-nourished, NAD Neck: No masses, trachea midline.  Respiratory: Normal respiratory effort. no wheeze, no rhonchi, no rales Cardiovascular: S1/S2 normal, no murmur, no rub/gallop auscultated. RRR.  Musculoskeletal: Gait normal. Symmetric and independent movement of all extremities Abdominal: non-tender, non-distended, no appreciable organomegaly, neg Murphy's, BS [id number] Neurological: Normal balance/coordination. No tremor. Skin: warm, dry, intact.  Psychiatric: Normal judgment/insight. Normal mood and affect. Oriented x3.   Current Meds  Medication Sig   [DISCONTINUED] amlodipine-atorvastatin (CADUET) 10-20 MG tablet TAKE ONE TABLET BY MOUTH DAILY   [DISCONTINUED] cetirizine (ZYRTEC) 10 MG tablet Take 1 tablet (10 mg total) by mouth 2 (two) times daily.   [DISCONTINUED] hydrochlorothiazide (HYDRODIURIL) 25 MG tablet TAKE ONE TABLET BY MOUTH DAILY   [DISCONTINUED] pantoprazole (PROTONIX) 40 MG tablet TAKE ONE TABLET BY MOUTH DAILY    Allergies  Allergen Reactions   Penicillins Shortness Of Breath and Swelling    Patient Active Problem List   Diagnosis Date Noted   Allergic rhinitis 06/14/2019   Arthralgia 06/14/2019   Erectile dysfunction 06/14/2019   GERD (gastroesophageal reflux disease) 06/14/2019   Internal hemorrhoid 11/21/2018   Diverticulosis 11/21/2018   Adenomatous polyp of colon 11/21/2018   Hypertension 05/30/2017   Hypotestosteronism 05/30/2017   Dyspnea on exertion 02/08/2017   Tobacco use 07/19/2016   Abnormal glucose 06/25/2014   Atopic dermatitis 12/06/2013   Psoriasis 12/06/2013    Headache 08/03/2012   Plantar fasciitis 09/16/2011   Anxiety 05/30/2011   Renal insufficiency 05/30/2011    Family History  Problem Relation Age of Onset   Liver cancer Mother    Heart attack Father    High blood pressure Father     Social History   Tobacco Use  Smoking Status Every Day  Smokeless Tobacco Never  Tobacco Comments   30+ pack years, has cut back to 1 ppd    History reviewed. No pertinent surgical history.  Immunization History  Administered Date(s) Administered   Influenza Split 01/22/2010   Influenza, Seasonal, Injecte, Preservative Fre 03/12/2014, 03/18/2015   Influenza,inj,Quad PF,6+ Mos 05/30/2017, 05/24/2019   Influenza-Unspecified 03/04/2011, 04/20/2012, 02/22/2013   PFIZER(Purple Top)SARS-COV-2 Vaccination 06/21/2019, 07/17/2019, 01/30/2020   Tdap 05/24/2019   Zoster Recombinat (Shingrix) 02/28/2018, 05/23/2018    No results found for this or any previous visit (from the past 2160 hour(s)).  No results found.     All questions at time of visit were answered - patient instructed to contact office with any additional concerns or updates. ER/RTC precautions were reviewed with the patient as applicable.   Please note: manual typing as well as voice recognition software may have been used to  produce this document - typos may escape review. Please contact Dr. Sheppard Coil for any needed clarifications.

## 2020-11-26 NOTE — Telephone Encounter (Signed)
Sent clonazepam to pharmacy, this should work for situational anxiety

## 2020-11-26 NOTE — Telephone Encounter (Signed)
Pt left a vm msg stating he forgot to tell you that he plans to travel to [place] in December. Patient is not a big fan of flying and gets very anxious. He mentioned that he was given a sedative years ago but could not recall the name of the medication.

## 2020-11-26 NOTE — Patient Instructions (Addendum)
General Preventive Care Most recent routine screening labs: ordered.  Blood pressure goal 130/80 or less.  Tobacco: don't! Please let me know if you need help quitting! Alcohol: responsible moderation is ok for most adults - if you have concerns about your alcohol intake, please talk to me!  Exercise: as tolerated to reduce risk of cardiovascular disease and diabetes. Strength training will also prevent osteoporosis.  Mental health: if need for mental health care (medicines, counseling, other), or concerns about moods, please let me know!  Sexual / Reproductive health: if need for STD testing, or if concerns with libido/pain problems, please let me know! Advanced Directive: Living Will and/or Healthcare Power of Attorney recommended for all adults, regardless of age or health.  Vaccines Flu vaccine: every fall.  Shingles vaccine: all done!   Pneumonia vaccines: after age 59, advised sooner for smokers  Tetanus booster: every 10 years - done 05/24/2019, due 2031 COVID vaccine: THANKS for getting your vaccine! :) FALL BOOSTER RECOMMENDED  Cancer screenings  Colon cancer screening: due 2030 Prostate cancer screening: PSA blood test age 80-71 Lung cancer screening: CT chest every year for those aged 36 to 31 years who have a 20 pack-year smoking history and currently smoke or have quit within the past 15 years  Infection screenings  HIV: recommended screening at least once age 15-65, DONE Gonorrhea/Chlamydia: screening as needed Hepatitis C: recommended once for everyone age 0000000 - DONE TB: certain at-risk populations, or depending on work requirements and/or travel history Other Bone Density Test: recommended for men at age 76 Abdominal Aortic Aneurysm: screening with ultrasound recommended once for men age 18-75 who have ever smoked

## 2020-11-27 ENCOUNTER — Ambulatory Visit: Payer: Managed Care, Other (non HMO) | Admitting: Osteopathic Medicine

## 2020-11-27 LAB — CBC
HCT: 55.5 % — ABNORMAL HIGH (ref 38.5–50.0)
Hemoglobin: 18.9 g/dL — ABNORMAL HIGH (ref 13.2–17.1)
MCH: 27.4 pg (ref 27.0–33.0)
MCHC: 34.1 g/dL (ref 32.0–36.0)
MCV: 80.3 fL (ref 80.0–100.0)
MPV: 10.8 fL (ref 7.5–12.5)
Platelets: 196 10*3/uL (ref 140–400)
RBC: 6.91 10*6/uL — ABNORMAL HIGH (ref 4.20–5.80)
RDW: 14.1 % (ref 11.0–15.0)
WBC: 7.8 10*3/uL (ref 3.8–10.8)

## 2020-11-27 LAB — COMPLETE METABOLIC PANEL WITH GFR
AG Ratio: 1.6 (calc) (ref 1.0–2.5)
ALT: 18 U/L (ref 9–46)
AST: 16 U/L (ref 10–35)
Albumin: 4.5 g/dL (ref 3.6–5.1)
Alkaline phosphatase (APISO): 108 U/L (ref 35–144)
BUN: 10 mg/dL (ref 7–25)
CO2: 26 mmol/L (ref 20–32)
Calcium: 9.6 mg/dL (ref 8.6–10.3)
Chloride: 101 mmol/L (ref 98–110)
Creat: 0.98 mg/dL (ref 0.70–1.35)
Globulin: 2.9 g/dL (calc) (ref 1.9–3.7)
Glucose, Bld: 97 mg/dL (ref 65–99)
Potassium: 4.5 mmol/L (ref 3.5–5.3)
Sodium: 136 mmol/L (ref 135–146)
Total Bilirubin: 0.7 mg/dL (ref 0.2–1.2)
Total Protein: 7.4 g/dL (ref 6.1–8.1)
eGFR: 86 mL/min/{1.73_m2} (ref >=60)

## 2020-11-27 LAB — LIPID PANEL
Cholesterol: 150 mg/dL (ref <200)
HDL: 43 mg/dL (ref >=40)
LDL Cholesterol (Calc): 81 mg/dL (calc)
Non-HDL Cholesterol (Calc): 107 mg/dL (calc) (ref <130)
Total CHOL/HDL Ratio: 3.5 (calc) (ref <5.0)
Triglycerides: 159 mg/dL — ABNORMAL HIGH (ref <150)

## 2020-11-27 LAB — PSA, TOTAL WITH REFLEX TO PSA, FREE: PSA, Total: 0.8 ng/mL (ref <=4.0)

## 2020-11-30 NOTE — Telephone Encounter (Signed)
Left a detailed vm msg for patient regarding that anxiety rx was sent to the pharmacy. Direct call back info provided.

## 2021-01-09 ENCOUNTER — Emergency Department (INDEPENDENT_AMBULATORY_CARE_PROVIDER_SITE_OTHER)
Admission: EM | Admit: 2021-01-09 | Discharge: 2021-01-09 | Disposition: A | Discharge status: Home / Self Care | Payer: Managed Care, Other (non HMO) | Source: Home / Self Care | Attending: Family Medicine | Admitting: Family Medicine

## 2021-01-09 ENCOUNTER — Other Ambulatory Visit: Payer: Self-pay

## 2021-01-09 DIAGNOSIS — U071 COVID-19: Secondary | ICD-10-CM | POA: Diagnosis not present

## 2021-01-09 DIAGNOSIS — J439 Emphysema, unspecified: Secondary | ICD-10-CM | POA: Insufficient documentation

## 2021-01-09 DIAGNOSIS — I700 Atherosclerosis of aorta: Secondary | ICD-10-CM | POA: Insufficient documentation

## 2021-01-09 DIAGNOSIS — I2510 Atherosclerotic heart disease of native coronary artery without angina pectoris: Secondary | ICD-10-CM | POA: Insufficient documentation

## 2021-01-09 MED ORDER — NIRMATRELVIR/RITONAVIR (PAXLOVID)TABLET: 3.0000 | ORAL_TABLET | Freq: Two times a day (BID) | ORAL | 0 refills | Status: AC

## 2021-01-09 NOTE — Discharge Instructions (Signed)
Take paxlovid 2 times a day for 5 days Drink lots of fluids May take Tylenol or ibuprofen for pain and fever Call for problems

## 2021-01-09 NOTE — ED Provider Notes (Signed)
[facility]    CSN: [id number] Arrival date & time: 01/09/21  2992      History   Chief Complaint Chief Complaint  Patient presents with   Covid Positive    HPI James Alexander is a 65 y.o. male.   HPI  Patient is a pleasant 65 year old gentleman.  His wife is positive for COVID.  He is feeling lousy.  He did a home COVID test and it was positive.  He is here because his work requires a note with documentation of his COVID status. I discussed with him that he is a candidate for paxlovid.  He 65 years old with hypertension heart disease and lung disease, a smoker.  He is interested in a prescription and this is sent to his pharmacy.  We did discuss the possibility of rebound.  Also discussed quarantine and recommended mask wearing  Past Medical History:  Diagnosis Date   High blood pressure     Patient Active Problem List   Diagnosis Date Noted   Emphysema lung ([place]) 01/09/2021   Aortic atherosclerosis ([place]) 01/09/2021   Coronary atherosclerosis of native coronary artery 01/09/2021   Allergic rhinitis 06/14/2019   Arthralgia 06/14/2019   Erectile dysfunction 06/14/2019   GERD (gastroesophageal reflux disease) 06/14/2019   Internal hemorrhoid 11/21/2018   Diverticulosis 11/21/2018   Adenomatous polyp of colon 11/21/2018   Hypertension 05/30/2017   Hypotestosteronism 05/30/2017   Dyspnea on exertion 02/08/2017   Tobacco use 07/19/2016   Abnormal glucose 06/25/2014   Atopic dermatitis 12/06/2013   Psoriasis 12/06/2013   Anxiety 05/30/2011   Renal insufficiency 05/30/2011    History reviewed. No pertinent surgical history.     Home Medications    Prior to Admission medications   Medication Sig Start Date End Date Taking? Authorizing Provider  nirmatrelvir/ritonavir EUA (PAXLOVID) 20 x 150 MG & 10 x 100MG  TABS Take 3 tablets by mouth 2 (two) times daily for 5 days. Patient GFR is 87. Take nirmatrelvir (150 mg) two tablets twice daily for 5 days and  ritonavir (100 mg) one tablet twice daily for 5 days. 01/09/21 01/14/21 Yes Raylene Everts, MD  amlodipine-atorvastatin (CADUET) 10-20 MG tablet Take 1 tablet by mouth daily. 11/26/20   Emeterio Reeve, DO  cetirizine (ZYRTEC) 10 MG tablet Take 1 tablet (10 mg total) by mouth daily. 11/26/20   Emeterio Reeve, DO  clonazePAM (KLONOPIN) 0.5 MG tablet Take 1 tablet (0.5 mg total) by mouth 3 (three) times daily as needed for anxiety. 11/26/20   Emeterio Reeve, DO  hydrochlorothiazide (HYDRODIURIL) 25 MG tablet Take 1 tablet (25 mg total) by mouth daily. 11/26/20   Emeterio Reeve, DO    Family History Family History  Problem Relation Age of Onset   Liver cancer Mother    Heart attack Father    High blood pressure Father     Social History Social History   Tobacco Use   Smoking status: Every Day   Smokeless tobacco: Never   Tobacco comments:    30+ pack years, has cut back to 1 ppd  Vaping Use   Vaping Use: Never used  Substance Use Topics   Alcohol use: Yes   Drug use: No     Allergies   Penicillins   Review of Systems Review of Systems See HPI  Physical Exam Triage Vital Signs ED Triage Vitals [01/09/21 0842]  Enc Vitals Group     BP (!) 142/92     Pulse Rate 79  Resp 18     Temp 98.3 F (36.8 C)     Temp Source Oral     SpO2 95 %     Weight      Height      Head Circumference      Peak Flow      Pain Score 0     Pain Loc      Pain Edu?      Excl. in [place]?    No data found.  Updated Vital Signs BP (!) 142/92 (BP Location: Left Arm)   Pulse 79   Temp 98.3 F (36.8 C) (Oral)   Resp 18   SpO2 95%      Physical Exam Constitutional:      General: He is not in acute distress.    Appearance: He is well-developed. He is ill-appearing.  HENT:     Head: Normocephalic and atraumatic.  Eyes:     Conjunctiva/sclera: Conjunctivae normal.     Pupils: Pupils are equal, round, and reactive to light.  Cardiovascular:     Rate and Rhythm: Normal  rate.  Pulmonary:     Effort: Pulmonary effort is normal. No respiratory distress.  Abdominal:     General: There is no distension.     Palpations: Abdomen is soft.  Musculoskeletal:        General: Normal range of motion.     Cervical back: Normal range of motion.  Skin:    General: Skin is warm and dry.  Neurological:     Mental Status: He is alert.  Psychiatric:        Mood and Affect: Mood normal.        Behavior: Behavior normal.     UC Treatments / Results  Labs (all labs ordered are listed, but only abnormal results are displayed) Labs Reviewed - No data to display  EKG   Radiology No results found.  Procedures Procedures (including critical care time)  Medications Ordered in UC Medications - No data to display  Initial Impression / Assessment and Plan / UC Course  I have reviewed the triage vital signs and the nursing notes.  Pertinent labs & imaging results that were available during my care of the patient were reviewed by me and considered in my medical decision making (see chart for details).     Patient initially requested repeat COVID test.  I let him know this was not necessary.  With positive exposure, symptoms, and a positive home test I am certain he has COVID.  Prescribed paxlovid for 5 days.  I checked his medicines to make sure there were no contraindications.  He has good kidney function.  Call for problems Final Clinical Impressions(s) / UC Diagnoses   Final diagnoses:  [id number]     Discharge Instructions      Take paxlovid 2 times a day for 5 days Drink lots of fluids May take Tylenol or ibuprofen for pain and fever Call for problems   ED Prescriptions     Medication Sig Dispense Auth. Provider   nirmatrelvir/ritonavir EUA (PAXLOVID) 20 x 150 MG & 10 x 100MG  TABS Take 3 tablets by mouth 2 (two) times daily for 5 days. Patient GFR is 87. Take nirmatrelvir (150 mg) two tablets twice daily for 5 days and ritonavir (100 mg) one tablet  twice daily for 5 days. 30 tablet Raylene Everts, MD      PDMP not reviewed this encounter.   Raylene Everts, MD 01/09/21 [phone]

## 2021-01-09 NOTE — ED Triage Notes (Addendum)
Pt present exposure to covid and tested positive today from an at home test. Pt states his spouse is covid positive and he just feel like sick.

## 2021-02-13 ENCOUNTER — Other Ambulatory Visit: Payer: Self-pay | Admitting: Osteopathic Medicine

## 2021-02-15 NOTE — Telephone Encounter (Signed)
H/T pharmacy is requesting med refill for omeprazole. Rx not listed in active med list. Per provider's note on 05/29/20 "Will try Pantoprazole (Protonix) for acid reflux for 2-3 months then stop or transition to Famotidine (Pepcid) for long-term use if desired ".

## 2021-03-08 ENCOUNTER — Telehealth: Payer: Self-pay | Admitting: General Practice

## 2021-03-08 NOTE — Telephone Encounter (Signed)
Transition Care Management Unsuccessful Follow-up Telephone Call  Date of discharge and from where:  03/07/21 from [facility]  Attempts:  1st Attempt  Reason for unsuccessful TCM follow-up call:  Left voice message Patient has a hospital follow up scheduled for 03/11/21 with Caleen Jobs, NP

## 2021-03-09 NOTE — Telephone Encounter (Signed)
Transition Care Management Follow-up Telephone Call Date of discharge and from where: 03/06/2021 from [facility] How have you been since you were released from the hospital? Pt stated that he is feeling very good today. Pt did not have any questions or concerns at this time.  Any questions or concerns? No  Items Reviewed: Did the pt receive and understand the discharge instructions provided? Yes  Medications obtained and verified? Yes  Other? No  Any new allergies since your discharge? No  Dietary orders reviewed? No Do you have support at home? Yes   Functional Questionnaire: (I = Independent and D = Dependent) ADLs: I  Bathing/Dressing- I  Meal Prep- I  Eating- I  Maintaining continence- I  Transferring/Ambulation- I  Managing Meds- I   Follow up appointments reviewed:  PCP Hospital f/u appt confirmed? Yes  Scheduled to see Caleen Jobs, NP on 03/11/2021 @ 10:10am.  [facility] f/u appt confirmed? No   Are transportation arrangements needed? No  If their condition worsens, is the pt aware to call PCP or go to the Emergency Dept.? Yes Was the patient provided with contact information for the PCP's office or ED? Yes Was to pt encouraged to call back with questions or concerns? Yes

## 2021-03-10 NOTE — Progress Notes (Signed)
Acute Office Visit  Subjective:    Patient ID: James Alexander, male    DOB: 01-08-1956, 65 y.o.   MRN: [id number]  Chief Complaint  Patient presents with   Hospitalization Follow-up    Gall bladder     HPI Patient is in today for hospital follow-up.  03/06/21: Patient presented to the ED for abdominal pain and indigestion.  He had abdominal xray, CT, and [place]. He was found to have cholelithiasis and biliary sludge, but no acute signs of infection. Liver enzymes were slightly elevated and US showed some echogenicity of liver. He was told to follow-up with PCP to recheck labs and possibly order a HIDA scan. He was treated with pain medicine, antiemetics and IV fluids.   Patient reports he has been feeling fine ever since his ED visit.  He has not had any problems/flares.  He denies any abdominal pain, indigestion, nausea, vomiting, diarrhea, blood in urine or stool, fatigue/malaise, fevers.  He does admit to drinking a couple of beers every day.  He is concerned about some of the results from his ED visit.  He would like to proceed with a HIDA scan to further evaluate.  States if he were to need surgery, he prefers for someone within [facility] to recommend a Psychologist, sport and exercise.   Past Medical History:  Diagnosis Date   High blood pressure     No past surgical history on file.  Family History  Problem Relation Age of Onset   Liver cancer Mother    Heart attack Father    High blood pressure Father     Social History   Socioeconomic History   Marital status: Married    Spouse name: Winfield Caba   Number of children: 2   Years of education: Not on file   Highest education level: Not on file  Occupational History   Occupation: TRUCK DRIVER    Employer: [employer]  Tobacco Use   Smoking status: Every Day   Smokeless tobacco: Never   Tobacco comments:    30+ pack years, has cut back to 1 ppd  Vaping Use   Vaping Use: Never used  Substance and Sexual Activity   Alcohol use: Yes   Drug  use: No   Sexual activity: Never    Partners: Female    Birth control/protection: None  Other Topics Concern   Not on file  Social History Narrative   Not on file   Social Determinants of Health   Financial Resource Strain: Not on file  Food Insecurity: Not on file  Transportation Needs: Not on file  Physical Activity: Not on file  Stress: Not on file  Social Connections: Not on file  Intimate Partner Violence: Not on file    Outpatient Medications Prior to Visit  Medication Sig Dispense Refill   amlodipine-atorvastatin (CADUET) 10-20 MG tablet Take 1 tablet by mouth daily. 90 tablet 3   cetirizine (ZYRTEC) 10 MG tablet Take 1 tablet (10 mg total) by mouth daily. 90 tablet 3   clonazePAM (KLONOPIN) 0.5 MG tablet Take 1 tablet (0.5 mg total) by mouth 3 (three) times daily as needed for anxiety. 6 tablet 1   hydrochlorothiazide (HYDRODIURIL) 25 MG tablet Take 1 tablet (25 mg total) by mouth daily. 90 tablet 3   No facility-administered medications prior to visit.    Allergies  Allergen Reactions   Penicillins Shortness Of Breath and Swelling    Review of Systems All review of systems negative except what is listed in  the HPI     Objective:    Physical Exam Vitals reviewed.  Constitutional:      Appearance: Normal appearance.  HENT:     Head: Normocephalic and atraumatic.  Cardiovascular:     Rate and Rhythm: Normal rate and regular rhythm.     Heart sounds: Normal heart sounds.  Pulmonary:     Effort: Pulmonary effort is normal.     Breath sounds: Normal breath sounds.  Abdominal:     General: Bowel sounds are normal. There is no distension.     Palpations: Abdomen is soft. There is no mass.     Tenderness: There is no abdominal tenderness. There is no guarding or rebound.     Hernia: No hernia is present.  Skin:    General: Skin is warm and dry.  Neurological:     General: No focal deficit present.     Mental Status: He is alert and oriented to person,  place, and time. Mental status is at baseline.  Psychiatric:        Mood and Affect: Mood normal.        Behavior: Behavior normal.        Thought Content: Thought content normal.        Judgment: Judgment normal.    BP 128/81 (BP Location: Left Arm, Patient Position: Sitting, Cuff Size: Normal)   Pulse 69   Temp 98.6 F (37 C) (Oral)   Wt 219 lb 1.9 oz (99.4 kg)   SpO2 95%   BMI 32.36 kg/m  Wt Readings from Last 3 Encounters:  03/11/21 219 lb 1.9 oz (99.4 kg)  11/26/20 221 lb (100.2 kg)  05/29/20 225 lb (102.1 kg)    There are no preventive care reminders to display for this patient.   There are no preventive care reminders to display for this patient.   No results found for: TSH Lab Results  Component Value Date   WBC 7.8 11/26/2020   HGB 18.9 (H) 11/26/2020   HCT 55.5 (H) 11/26/2020   MCV 80.3 11/26/2020   PLT 196 11/26/2020   Lab Results  Component Value Date   NA 136 11/26/2020   K 4.5 11/26/2020   CO2 26 11/26/2020   GLUCOSE 97 11/26/2020   BUN 10 11/26/2020   CREATININE 0.98 11/26/2020   BILITOT 0.7 11/26/2020   AST 16 11/26/2020   ALT 18 11/26/2020   PROT 7.4 11/26/2020   CALCIUM 9.6 11/26/2020   EGFR 86 11/26/2020   Lab Results  Component Value Date   CHOL 150 11/26/2020   Lab Results  Component Value Date   HDL 43 11/26/2020   Lab Results  Component Value Date   LDLCALC 81 11/26/2020   Lab Results  Component Value Date   TRIG 159 (H) 11/26/2020   Lab Results  Component Value Date   CHOLHDL 3.5 11/26/2020   No results found for: HGBA1C     Assessment & Plan:    1. Need for influenza vaccination - Flu Vaccine QUAD High Dose(Fluad)  2. RUQ abdominal pain 3. Abnormal abdominal ultrasound 4. Elevated liver enzymes 5. Abnormal finding on urinalysis Patient doing well today. Normal exam. Repeating blood work, urinalysis today to check for improvement. Ordering HIDA scan to further evaluate liver and gallbladder. Follow-up pending  results. Patient aware of signs/symptoms requiring further/urgent evaluation.  - COMPLETE METABOLIC PANEL WITH GFR - CBC with Differential - Urinalysis, Routine w reflex microscopic - NM Hepato W/EF; Future  Follow-up pending results or  as needed.   Purcell Nails Olevia Bowens, DNP, FNP-C

## 2021-03-11 ENCOUNTER — Ambulatory Visit: Payer: Managed Care, Other (non HMO) | Admitting: Family Medicine

## 2021-03-11 ENCOUNTER — Encounter: Payer: Self-pay | Admitting: Family Medicine

## 2021-03-11 ENCOUNTER — Other Ambulatory Visit: Payer: Self-pay

## 2021-03-11 VITALS — BP 128/81 | HR 69 | Temp 98.6°F | Wt 219.1 lb

## 2021-03-11 DIAGNOSIS — R748 Abnormal levels of other serum enzymes: Secondary | ICD-10-CM

## 2021-03-11 DIAGNOSIS — Z23 Encounter for immunization: Secondary | ICD-10-CM | POA: Diagnosis not present

## 2021-03-11 DIAGNOSIS — R8290 Unspecified abnormal findings in urine: Secondary | ICD-10-CM

## 2021-03-11 DIAGNOSIS — R935 Abnormal findings on diagnostic imaging of other abdominal regions, including retroperitoneum: Secondary | ICD-10-CM

## 2021-03-11 DIAGNOSIS — R1011 Right upper quadrant pain: Secondary | ICD-10-CM | POA: Diagnosis not present

## 2021-03-12 LAB — COMPLETE METABOLIC PANEL WITH GFR
AG Ratio: 1.5 (calc) (ref 1.0–2.5)
ALT: 134 U/L — ABNORMAL HIGH (ref 9–46)
AST: 35 U/L (ref 10–35)
Albumin: 4.4 g/dL (ref 3.6–5.1)
Alkaline phosphatase (APISO): 225 U/L — ABNORMAL HIGH (ref 35–144)
BUN: 9 mg/dL (ref 7–25)
CO2: 29 mmol/L (ref 20–32)
Calcium: 9.4 mg/dL (ref 8.6–10.3)
Chloride: 103 mmol/L (ref 98–110)
Creat: 0.8 mg/dL (ref 0.70–1.35)
Globulin: 2.9 g/dL (calc) (ref 1.9–3.7)
Glucose, Bld: 91 mg/dL (ref 65–99)
Potassium: 4.5 mmol/L (ref 3.5–5.3)
Sodium: 139 mmol/L (ref 135–146)
Total Bilirubin: 0.9 mg/dL (ref 0.2–1.2)
Total Protein: 7.3 g/dL (ref 6.1–8.1)
eGFR: 99 mL/min/{1.73_m2} (ref >=60)

## 2021-03-12 LAB — URINALYSIS, ROUTINE W REFLEX MICROSCOPIC
Bilirubin Urine: NEGATIVE
Glucose, UA: NEGATIVE
Hgb urine dipstick: NEGATIVE
Ketones, ur: NEGATIVE
Leukocytes,Ua: NEGATIVE
Nitrite: NEGATIVE
Protein, ur: NEGATIVE
Specific Gravity, Urine: 1.025 (ref 1.001–1.035)
pH: <=5 (ref 5.0–8.0)

## 2021-03-12 LAB — CBC WITH DIFFERENTIAL/PLATELET
Absolute Monocytes: 682 cells/uL (ref 200–950)
Basophils Absolute: 86 cells/uL (ref 0–200)
Basophils Relative: 0.9 %
Eosinophils Absolute: 230 cells/uL (ref 15–500)
Eosinophils Relative: 2.4 %
HCT: 51.4 % — ABNORMAL HIGH (ref 38.5–50.0)
Hemoglobin: 17.1 g/dL (ref 13.2–17.1)
Lymphs Abs: 2477 cells/uL (ref 850–3900)
MCH: 27.6 pg (ref 27.0–33.0)
MCHC: 33.3 g/dL (ref 32.0–36.0)
MCV: 82.9 fL (ref 80.0–100.0)
MPV: 10.9 fL (ref 7.5–12.5)
Monocytes Relative: 7.1 %
Neutro Abs: 6125 cells/uL (ref 1500–7800)
Neutrophils Relative %: 63.8 %
Platelets: 180 10*3/uL (ref 140–400)
RBC: 6.2 10*6/uL — ABNORMAL HIGH (ref 4.20–5.80)
RDW: 13.1 % (ref 11.0–15.0)
Total Lymphocyte: 25.8 %
WBC: 9.6 10*3/uL (ref 3.8–10.8)

## 2021-03-12 NOTE — Addendum Note (Signed)
Addended by: Caleen Jobs B on: 03/12/2021 08:03 AM   Modules accepted: Orders

## 2021-03-16 ENCOUNTER — Telehealth: Payer: Self-pay | Admitting: *Deleted

## 2021-03-16 NOTE — Telephone Encounter (Signed)
I spoke with pt this morning to advise him of when his nuclear med scan is scheduled for.  He is supposed to be flying to [place] for a week on Dec 24th and will be gone for a week.  He's wondering if he should still go or stay home, he is afraid of having an "attack" and having to have emergeny gallbladder surgery out there.  He would like a professional opinion/recommendation.

## 2021-03-16 NOTE — Telephone Encounter (Signed)
It is completely up to him. I cannot say whether or not anything will happen. Labs have been improving, but anything is possible. He can help prevent this from happening by avoiding trigger foods - heavy, fatty/greasy meals.

## 2021-03-17 NOTE — Telephone Encounter (Signed)
Pt notified of provider recommendations. 

## 2021-03-24 ENCOUNTER — Other Ambulatory Visit: Payer: Self-pay

## 2021-03-24 DIAGNOSIS — R748 Abnormal levels of other serum enzymes: Secondary | ICD-10-CM

## 2021-03-27 LAB — COMPLETE METABOLIC PANEL WITH GFR
AG Ratio: 1.4 (calc) (ref 1.0–2.5)
ALT: 58 U/L — ABNORMAL HIGH (ref 9–46)
AST: 20 U/L (ref 10–35)
Albumin: 4.3 g/dL (ref 3.6–5.1)
Alkaline phosphatase (APISO): 142 U/L (ref 35–144)
BUN: 15 mg/dL (ref 7–25)
CO2: 25 mmol/L (ref 20–32)
Calcium: 9.5 mg/dL (ref 8.6–10.3)
Chloride: 105 mmol/L (ref 98–110)
Creat: 0.95 mg/dL (ref 0.70–1.35)
Globulin: 3 g/dL (calc) (ref 1.9–3.7)
Glucose, Bld: 89 mg/dL (ref 65–99)
Potassium: 4.6 mmol/L (ref 3.5–5.3)
Sodium: 141 mmol/L (ref 135–146)
Total Bilirubin: 0.6 mg/dL (ref 0.2–1.2)
Total Protein: 7.3 g/dL (ref 6.1–8.1)
eGFR: 89 mL/min/{1.73_m2} (ref >=60)

## 2021-03-27 LAB — GAMMA GT: GGT: 161 U/L — ABNORMAL HIGH (ref 3–70)

## 2021-03-29 ENCOUNTER — Other Ambulatory Visit: Payer: Self-pay

## 2021-03-29 ENCOUNTER — Encounter (HOSPITAL_COMMUNITY)
Admission: RE | Admit: 2021-03-29 | Discharge: 2021-03-29 | Disposition: A | Discharge status: Home / Self Care | Payer: Managed Care, Other (non HMO) | Source: Ambulatory Visit | Attending: Family Medicine | Admitting: Family Medicine

## 2021-03-29 DIAGNOSIS — R748 Abnormal levels of other serum enzymes: Secondary | ICD-10-CM | POA: Diagnosis present

## 2021-03-29 DIAGNOSIS — R1011 Right upper quadrant pain: Secondary | ICD-10-CM | POA: Insufficient documentation

## 2021-03-29 DIAGNOSIS — R935 Abnormal findings on diagnostic imaging of other abdominal regions, including retroperitoneum: Secondary | ICD-10-CM | POA: Insufficient documentation

## 2021-03-29 MED ORDER — TECHNETIUM TC 99M MEBROFENIN IV KIT
5.4500 | PACK | Freq: Once | INTRAVENOUS | Status: AC | PRN
  Administered 2021-03-29: 11:00:00 5.45 via INTRAVENOUS

## 2021-04-15 ENCOUNTER — Encounter: Payer: Self-pay | Admitting: Family Medicine

## 2021-04-15 ENCOUNTER — Ambulatory Visit: Payer: Managed Care, Other (non HMO) | Admitting: Family Medicine

## 2021-04-15 ENCOUNTER — Other Ambulatory Visit: Payer: Self-pay

## 2021-04-15 DIAGNOSIS — R7401 Elevation of levels of liver transaminase levels: Secondary | ICD-10-CM | POA: Diagnosis not present

## 2021-04-15 NOTE — Progress Notes (Signed)
James Alexander - 66 y.o. male MRN [id number]  Date of birth: 03-Jul-1955  Subjective Chief Complaint  Patient presents with   lab results    HPI James Alexander is a 66 year old male here today for follow-up of elevated liver enzymes and abdominal pain.  Initially seen in the ED on 03/06/2021 for indigestion like symptoms and epigastric abdominal pain.  Ultrasound at that time showed cholelithiasis and biliary sludge as well as transaminitis and elevated bilirubin.  He followed up with our clinic and had LFTs which were trending down.  He also had HIDA scan which showed normal gallbladder EF.  He reports that his pain is improved.  Additional labs show that LFT has continued to trend down.  Additionally hepatitis serology was negative.  He denies nausea, vomiting or fever.  ROS:  A comprehensive ROS was completed and negative except as noted per HPI  Allergies  Allergen Reactions   Penicillins Shortness Of Breath and Swelling    Past Medical History:  Diagnosis Date   High blood pressure     History reviewed. No pertinent surgical history.  Social History   Socioeconomic History   Marital status: Married    Spouse name: Shenandoah Vandergriff   Number of children: 2   Years of education: Not on file   Highest education level: Not on file  Occupational History   Occupation: [employer]    Employer: [employer]  Tobacco Use   Smoking status: Every Day   Smokeless tobacco: Never   Tobacco comments:    30+ pack years, has cut back to 1 ppd  Vaping Use   Vaping Use: Never used  Substance and Sexual Activity   Alcohol use: Yes   Drug use: No   Sexual activity: Never    Partners: Female    Birth control/protection: None  Other Topics Concern   Not on file  Social History Narrative   Not on file   Social Determinants of Health   Financial Resource Strain: Not on file  Food Insecurity: Not on file  Transportation Needs: Not on file  Physical Activity: Not on file  Stress: Not on file   Social Connections: Not on file    Family History  Problem Relation Age of Onset   Liver cancer Mother    Heart attack Father    High blood pressure Father     Health Maintenance  Topic Date Due   COVID-19 Vaccine (4 - Booster for [place] series) 03/26/2020   Pneumococcal Vaccine 96-3 Years old (1 - PCV) 03/11/2022 (Originally 08/11/1961)   COLONOSCOPY (Pts 45-26yrs Insurance coverage will need to be confirmed)  02/20/2029   TETANUS/TDAP  05/23/2029   INFLUENZA VACCINE  Completed   Hepatitis C Screening  Completed   HIV Screening  Completed   Zoster Vaccines- Shingrix  Completed   HPV VACCINES  Aged Out     ----------------------------------------------------------------------------------------------------------------------------------------------------------------------------------------------------------------- Physical Exam BP (!) 143/75 (BP Location: Left Arm, Patient Position: Sitting, Cuff Size: Normal)    Pulse 77    Temp 98.2 F (36.8 C)    Ht 5\' 9"  (1.753 m)    Wt 219 lb (99.3 kg)    SpO2 96%    BMI 32.34 kg/m   Physical Exam Constitutional:      Appearance: Normal appearance.  Eyes:     General: No scleral icterus. Cardiovascular:     Rate and Rhythm: Normal rate and regular rhythm.  Pulmonary:     Effort: Pulmonary effort is normal.  Breath sounds: Normal breath sounds.  Abdominal:     General: There is no distension.     Palpations: Abdomen is soft.     Tenderness: There is no abdominal tenderness.  Musculoskeletal:     Cervical back: Neck supple.  Neurological:     General: No focal deficit present.     Mental Status: He is alert.  Psychiatric:        Mood and Affect: Mood normal.        Behavior: Behavior normal.    ------------------------------------------------------------------------------------------------------------------------------------------------------------------------------------------------------------------- Assessment and  Plan  Transaminitis Likely cause of this is gallbladder disease.  He may have had a small biliary stone that passed which caused transient elevation in his liver enzymes.  He has not had any further pain and his LFTs have trended down.  We will plan to recheck these again in a few weeks to be sure this is normalized.  He is instructed to return to the hospital if having severe pain, uncontrollable nausea or vomiting or fever.   No orders of the defined types were placed in this encounter.   No follow-ups on file.    This visit occurred during the SARS-CoV-2 public health emergency.  Safety protocols were in place, including screening questions prior to the visit, additional usage of staff PPE, and extensive cleaning of exam room while observing appropriate contact time as indicated for disinfecting solutions.

## 2021-04-18 DIAGNOSIS — R7401 Elevation of levels of liver transaminase levels: Secondary | ICD-10-CM | POA: Insufficient documentation

## 2021-04-18 NOTE — Assessment & Plan Note (Signed)
Likely cause of this is gallbladder disease.  He may have had a small biliary stone that passed which caused transient elevation in his liver enzymes.  He has not had any further pain and his LFTs have trended down.  We will plan to recheck these again in a few weeks to be sure this is normalized.  He is instructed to return to the hospital if having severe pain, uncontrollable nausea or vomiting or fever.

## 2021-06-03 ENCOUNTER — Ambulatory Visit: Payer: Managed Care, Other (non HMO) | Admitting: Family Medicine

## 2021-06-03 ENCOUNTER — Other Ambulatory Visit: Payer: Self-pay

## 2021-06-03 ENCOUNTER — Encounter: Payer: Self-pay | Admitting: Family Medicine

## 2021-06-03 VITALS — BP 136/76 | HR 74 | Temp 97.6°F | Ht 67.0 in | Wt 210.0 lb

## 2021-06-03 DIAGNOSIS — R7401 Elevation of levels of liver transaminase levels: Secondary | ICD-10-CM

## 2021-06-03 DIAGNOSIS — L209 Atopic dermatitis, unspecified: Secondary | ICD-10-CM

## 2021-06-03 DIAGNOSIS — I10 Essential (primary) hypertension: Secondary | ICD-10-CM | POA: Diagnosis not present

## 2021-06-03 DIAGNOSIS — Z720 Tobacco use: Secondary | ICD-10-CM

## 2021-06-03 DIAGNOSIS — I2510 Atherosclerotic heart disease of native coronary artery without angina pectoris: Secondary | ICD-10-CM

## 2021-06-03 LAB — HEPATIC FUNCTION PANEL
AG Ratio: 1.5 (calc) (ref 1.0–2.5)
ALT: 16 U/L (ref 9–46)
AST: 14 U/L (ref 10–35)
Albumin: 4.2 g/dL (ref 3.6–5.1)
Alkaline phosphatase (APISO): 95 U/L (ref 35–144)
Bilirubin, Direct: 0.1 mg/dL (ref 0.0–0.2)
Globulin: 2.8 g/dL (calc) (ref 1.9–3.7)
Indirect Bilirubin: 0.5 mg/dL (calc) (ref 0.2–1.2)
Total Bilirubin: 0.6 mg/dL (ref 0.2–1.2)
Total Protein: 7 g/dL (ref 6.1–8.1)

## 2021-06-03 LAB — LIPID PANEL W/REFLEX DIRECT LDL
Cholesterol: 129 mg/dL (ref <200)
HDL: 49 mg/dL (ref >=40)
LDL Cholesterol (Calc): 64 mg/dL (calc)
Non-HDL Cholesterol (Calc): 80 mg/dL (calc) (ref <130)
Total CHOL/HDL Ratio: 2.6 (calc) (ref <5.0)
Triglycerides: 80 mg/dL (ref <150)

## 2021-06-03 LAB — BASIC METABOLIC PANEL
BUN: 10 mg/dL (ref 7–25)
CO2: 28 mmol/L (ref 20–32)
Calcium: 9.3 mg/dL (ref 8.6–10.3)
Chloride: 104 mmol/L (ref 98–110)
Creat: 0.88 mg/dL (ref 0.70–1.35)
Glucose, Bld: 105 mg/dL — ABNORMAL HIGH (ref 65–99)
Potassium: 4.2 mmol/L (ref 3.5–5.3)
Sodium: 138 mmol/L (ref 135–146)

## 2021-06-03 MED ORDER — CLOBETASOL PROPIONATE 0.05 % EX CREA: 1.0000 "application " | TOPICAL_CREAM | Freq: Two times a day (BID) | CUTANEOUS | 1 refills | Status: DC

## 2021-06-03 NOTE — Progress Notes (Signed)
James Alexander - 66 y.o. male MRN [id number]  Date of birth: July 27, 1955  Subjective Chief Complaint  Patient presents with   Follow-up   Hypertension    HPI James Alexander is a 66 year old male here today for follow-up visit.  Reports he is doing well at this time.  Blood pressure has remained pretty well controlled with combination of amlodipine and hydrochlorothiazide.  No side effects from medication.  Denies chest pain, shortness of breath, palpitations, headaches or vision changes.  Tolerating atorvastatin for management of hyperlipidemia.  He does have rash on palms.  Rash is dry, scaly and itchy.  He has tried triamcinolone without much improvement.  He also has rash on his feet with some erythema on bilateral feet some improvement with tinactin.  ROS:  A comprehensive ROS was completed and negative except as noted per HPI  Allergies  Allergen Reactions   Penicillins Shortness Of Breath and Swelling    Past Medical History:  Diagnosis Date   High blood pressure     History reviewed. No pertinent surgical history.  Social History   Socioeconomic History   Marital status: Married    Spouse name: Mozell Hardacre   Number of children: 2   Years of education: Not on file   Highest education level: Not on file  Occupational History   Occupation: [employer]    Employer: [employer]  Tobacco Use   Smoking status: Every Day   Smokeless tobacco: Never   Tobacco comments:    30+ pack years, has cut back to 1 ppd  Vaping Use   Vaping Use: Never used  Substance and Sexual Activity   Alcohol use: Yes   Drug use: No   Sexual activity: Never    Partners: Female    Birth control/protection: None  Other Topics Concern   Not on file  Social History Narrative   Not on file   Social Determinants of Health   Financial Resource Strain: Not on file  Food Insecurity: Not on file  Transportation Needs: Not on file  Physical Activity: Not on file  Stress: Not on file  Social  Connections: Not on file    Family History  Problem Relation Age of Onset   Liver cancer Mother    Heart attack Father    High blood pressure Father     Health Maintenance  Topic Date Due   COVID-19 Vaccine (4 - Booster for [place] series) 03/26/2020   COLONOSCOPY (Pts 45-32yrs Insurance coverage will need to be confirmed)  02/20/2029   TETANUS/TDAP  05/23/2029   INFLUENZA VACCINE  Completed   Hepatitis C Screening  Completed   HIV Screening  Completed   Zoster Vaccines- Shingrix  Completed   HPV VACCINES  Aged Out     ----------------------------------------------------------------------------------------------------------------------------------------------------------------------------------------------------------------- Physical Exam BP 136/76    Pulse 74    Temp 97.6 F (36.4 C)    Ht 5\' 7"  (1.702 m)    Wt 210 lb (95.3 kg)    SpO2 96%    BMI 32.89 kg/m   Physical Exam Constitutional:      Appearance: Normal appearance.  Eyes:     General: No scleral icterus. Cardiovascular:     Rate and Rhythm: Normal rate and regular rhythm.  Pulmonary:     Effort: Pulmonary effort is normal.     Breath sounds: Normal breath sounds.  Musculoskeletal:     Cervical back: Neck supple.  Neurological:     Mental Status: He is alert.  Psychiatric:  Mood and Affect: Mood normal.        Behavior: Behavior normal.    ------------------------------------------------------------------------------------------------------------------------------------------------------------------------------------------------------------------- Assessment and Plan  Hypertension Blood pressure remains well controlled with current medications.  Recommend continuation of amlodipine and hydrochlorothiazide at current strength.  Atopic dermatitis Rash on palms with appearance consistent with atopic dermatitis.  Adding higher potency steroids.  Prescription for clobetasol sent in.  Tobacco  use Counseled on smoking cessation.  Transaminitis Updating hepatic function today.   Meds ordered this encounter  Medications   clobetasol cream (TEMOVATE) 0.05 %    Sig: Apply 1 application topically 2 (two) times daily.    Dispense:  60 g    Refill:  1    Return in about 6 months (around 12/01/2021) for CPE and FBW.    This visit occurred during the SARS-CoV-2 public health emergency.  Safety protocols were in place, including screening questions prior to the visit, additional usage of staff PPE, and extensive cleaning of exam room while observing appropriate contact time as indicated for disinfecting solutions.

## 2021-06-03 NOTE — Assessment & Plan Note (Signed)
Blood pressure remains well controlled with current medications.  Recommend continuation of amlodipine and hydrochlorothiazide at current strength.

## 2021-06-03 NOTE — Assessment & Plan Note (Signed)
Updating hepatic function today.

## 2021-06-03 NOTE — Assessment & Plan Note (Signed)
Counseled on smoking cessation  

## 2021-06-03 NOTE — Assessment & Plan Note (Signed)
Rash on palms with appearance consistent with atopic dermatitis.  Adding higher potency steroids.  Prescription for clobetasol sent in.

## 2021-06-03 NOTE — Patient Instructions (Signed)
Try zeasorb antifungal foot powder and lotrimin ultra on the feet.

## 2021-06-18 ENCOUNTER — Telehealth: Payer: Self-pay

## 2021-06-18 ENCOUNTER — Other Ambulatory Visit: Payer: Self-pay | Admitting: Family Medicine

## 2021-06-18 DIAGNOSIS — R21 Rash and other nonspecific skin eruption: Secondary | ICD-10-CM

## 2021-06-18 NOTE — Telephone Encounter (Signed)
The patient called requesting advice on an ongoing rash issue. He was seen by Doctor Zigmund Daniel on 06/03/2021 and was given clobetasol to trial. He reports the cream has not helped and the rash is very itchy still. I spoke with Dr. Zigmund Daniel and he recommended a referral to dermatology for them to do a further evaluation. I contacted the patient to make him aware. He was agreeable to the referral.  ?

## 2021-06-21 ENCOUNTER — Telehealth: Payer: Self-pay

## 2021-06-21 NOTE — Telephone Encounter (Signed)
Dermatology appt has been scheduled. ?

## 2021-09-22 DIAGNOSIS — M9901 Segmental and somatic dysfunction of cervical region: Secondary | ICD-10-CM | POA: Diagnosis not present

## 2021-09-22 DIAGNOSIS — M4721 Other spondylosis with radiculopathy, occipito-atlanto-axial region: Secondary | ICD-10-CM | POA: Diagnosis not present

## 2021-09-23 DIAGNOSIS — B353 Tinea pedis: Secondary | ICD-10-CM | POA: Diagnosis not present

## 2021-09-23 DIAGNOSIS — Z79899 Other long term (current) drug therapy: Secondary | ICD-10-CM | POA: Diagnosis not present

## 2021-09-23 DIAGNOSIS — B351 Tinea unguium: Secondary | ICD-10-CM | POA: Diagnosis not present

## 2021-09-29 DIAGNOSIS — M4721 Other spondylosis with radiculopathy, occipito-atlanto-axial region: Secondary | ICD-10-CM | POA: Diagnosis not present

## 2021-09-29 DIAGNOSIS — M9901 Segmental and somatic dysfunction of cervical region: Secondary | ICD-10-CM | POA: Diagnosis not present

## 2021-12-01 ENCOUNTER — Ambulatory Visit (INDEPENDENT_AMBULATORY_CARE_PROVIDER_SITE_OTHER): Payer: Medicare Other | Admitting: Family Medicine

## 2021-12-01 ENCOUNTER — Encounter: Payer: Self-pay | Admitting: Family Medicine

## 2021-12-01 VITALS — BP 141/88 | HR 74 | Temp 98.8°F | Ht 69.0 in | Wt 217.0 lb

## 2021-12-01 DIAGNOSIS — Z136 Encounter for screening for cardiovascular disorders: Secondary | ICD-10-CM

## 2021-12-01 DIAGNOSIS — I2510 Atherosclerotic heart disease of native coronary artery without angina pectoris: Secondary | ICD-10-CM

## 2021-12-01 DIAGNOSIS — Z125 Encounter for screening for malignant neoplasm of prostate: Secondary | ICD-10-CM | POA: Diagnosis not present

## 2021-12-01 DIAGNOSIS — Z0000 Encounter for general adult medical examination without abnormal findings: Secondary | ICD-10-CM | POA: Diagnosis not present

## 2021-12-01 DIAGNOSIS — Z122 Encounter for screening for malignant neoplasm of respiratory organs: Secondary | ICD-10-CM | POA: Diagnosis not present

## 2021-12-01 DIAGNOSIS — F17210 Nicotine dependence, cigarettes, uncomplicated: Secondary | ICD-10-CM | POA: Diagnosis not present

## 2021-12-01 DIAGNOSIS — Z23 Encounter for immunization: Secondary | ICD-10-CM

## 2021-12-01 DIAGNOSIS — I10 Essential (primary) hypertension: Secondary | ICD-10-CM

## 2021-12-01 MED ORDER — IPRATROPIUM BROMIDE 0.06 % NA SOLN: 2.0000 | Freq: Four times a day (QID) | NASAL | 6 refills | Status: DC | PRN

## 2021-12-01 NOTE — Addendum Note (Signed)
Addended by: Perlie Mayo on: 12/01/2021 09:23 AM   Modules accepted: Orders

## 2021-12-01 NOTE — Patient Instructions (Signed)
Preventive Care 65 Years and Older, Male Preventive care refers to lifestyle choices and visits with your health care provider that can promote health and wellness. Preventive care visits are also called wellness exams. What can I expect for my preventive care visit? Counseling During your preventive care visit, your health care provider may ask about your: Medical history, including: Past medical problems. Family medical history. History of falls. Current health, including: Emotional well-being. Home life and relationship well-being. Sexual activity. Memory and ability to understand (cognition). Lifestyle, including: Alcohol, nicotine or tobacco, and drug use. Access to firearms. Diet, exercise, and sleep habits. Work and work environment. Sunscreen use. Safety issues such as seatbelt and bike helmet use. Physical exam Your health care provider will check your: Height and weight. These may be used to calculate your BMI (body mass index). BMI is a measurement that tells if you are at a healthy weight. Waist circumference. This measures the distance around your waistline. This measurement also tells if you are at a healthy weight and may help predict your risk of certain diseases, such as type 2 diabetes and high blood pressure. Heart rate and blood pressure. Body temperature. Skin for abnormal spots. What immunizations do I need?  Vaccines are usually given at various ages, according to a schedule. Your health care provider will recommend vaccines for you based on your age, medical history, and lifestyle or other factors, such as travel or where you work. What tests do I need? Screening Your health care provider may recommend screening tests for certain conditions. This may include: Lipid and cholesterol levels. Diabetes screening. This is done by checking your blood sugar (glucose) after you have not eaten for a while (fasting). Hepatitis C test. Hepatitis B test. HIV (human  immunodeficiency virus) test. STI (sexually transmitted infection) testing, if you are at risk. Lung cancer screening. Colorectal cancer screening. Prostate cancer screening. Abdominal aortic aneurysm (AAA) screening. You may need this if you are a current or former smoker. Talk with your health care provider about your test results, treatment options, and if necessary, the need for more tests. Follow these instructions at home: Eating and drinking  Eat a diet that includes fresh fruits and vegetables, whole grains, lean protein, and low-fat dairy products. Limit your intake of foods with high amounts of sugar, saturated fats, and salt. Take vitamin and mineral supplements as recommended by your health care provider. Do not drink alcohol if your health care provider tells you not to drink. If you drink alcohol: Limit how much you have to 0-2 drinks a day. Know how much alcohol is in your drink. In the [place] one drink equals one 12 oz bottle of beer (355 mL), one 5 oz glass of wine (148 mL), or one 1 oz glass of hard liquor (44 mL). Lifestyle Brush your teeth every morning and night with fluoride toothpaste. Floss one time each day. Exercise for at least 30 minutes 5 or more days each week. Do not use any products that contain nicotine or tobacco. These products include cigarettes, chewing tobacco, and vaping devices, such as e-cigarettes. If you need help quitting, ask your health care provider. Do not use drugs. If you are sexually active, practice safe sex. Use a condom or other form of protection to prevent STIs. Take aspirin only as told by your health care provider. Make sure that you understand how much to take and what form to take. Work with your health care provider to find out whether it is safe   and beneficial for you to take aspirin daily. Ask your health care provider if you need to take a cholesterol-lowering medicine (statin). Find healthy ways to manage stress, such  as: Meditation, yoga, or listening to music. Journaling. Talking to a trusted person. Spending time with friends and family. Safety Always wear your seat belt while driving or riding in a vehicle. Do not drive: If you have been drinking alcohol. Do not ride with someone who has been drinking. When you are tired or distracted. While texting. If you have been using any mind-altering substances or drugs. Wear a helmet and other protective equipment during sports activities. If you have firearms in your house, make sure you follow all gun safety procedures. Minimize exposure to UV radiation to reduce your risk of skin cancer. What's next? Visit your health care provider once a year for an annual wellness visit. Ask your health care provider how often you should have your eyes and teeth checked. Stay up to date on all vaccines. This information is not intended to replace advice given to you by your health care provider. Make sure you discuss any questions you have with your health care provider. Document Revised: 09/23/2020 Document Reviewed: 09/23/2020 Elsevier Patient Education  2023 Elsevier Inc.  

## 2021-12-01 NOTE — Assessment & Plan Note (Signed)
Well adult Orders Placed This Encounter  Procedures  . [place] [facility] MEDICARE SCREENING    Standing Status:   Future    Standing Expiration Date:   12/02/2022    Order Specific Question:   Reason for Exam (SYMPTOM  OR DIAGNOSIS REQUIRED)    Answer:   Smoker, AAA screening    Order Specific Question:   Preferred imaging location?    Answer:   [facility]  . CT CHEST LUNG CA SCREEN LOW DOSE W/O CM    Standing Status:   Future    Standing Expiration Date:   12/02/2022    Order Specific Question:   Preferred Imaging Location?    Answer:   [facility]  . Pneumococcal conjugate vaccine 20-valent (Prevnar 20)  . COMPLETE METABOLIC PANEL WITH GFR  . CBC with Differential  . Lipid Panel w/reflex Direct LDL  . PSA  Screenings:  Per lab orders-Low dose CT ordered, AAA screening ordered Immunizations:  Prevnar 20 Anticipatory guidance/Risk factor Reduction:  Recommend smoking cessation.  Additional recommendations per AVS.

## 2021-12-01 NOTE — Progress Notes (Signed)
James Alexander - 66 y.o. male MRN [id number]  Date of birth: [birth year]  Subjective Chief Complaint  Patient presents with   Annual Exam    HPI James Alexander is a 66 y.o. male here today for annual exam.  He reports that he is doing well today.  Has a new granddaughter that was born yesterday.   He is not very physically active.  Feels like diet is pretty good.    He is daily smoker.  Consumes EtOH occasionally.   Due for flu vaccine and pneumonia vaccine.   Has not had lung cancer or AAA screening.    Review of Systems  Constitutional:  Negative for chills, fever, malaise/fatigue and weight loss.  HENT:  Negative for congestion, ear pain and sore throat.   Eyes:  Negative for blurred vision, double vision and pain.  Respiratory:  Negative for cough and shortness of breath.   Cardiovascular:  Negative for chest pain and palpitations.  Gastrointestinal:  Negative for abdominal pain, blood in stool, constipation, heartburn and nausea.  Genitourinary:  Negative for dysuria and urgency.  Musculoskeletal:  Negative for joint pain and myalgias.  Neurological:  Negative for dizziness and headaches.  Endo/Heme/Allergies:  Does not bruise/bleed easily.  Psychiatric/Behavioral:  Negative for depression. The patient is not nervous/anxious and does not have insomnia.     Allergies  Allergen Reactions   Penicillins Shortness Of Breath and Swelling    Past Medical History:  Diagnosis Date   High blood pressure     History reviewed. No pertinent surgical history.  Social History   Socioeconomic History   Marital status: Married    Spouse name: James Alexander   Number of children: 2   Years of education: Not on file   Highest education level: Not on file  Occupational History   Occupation: [place]    Employer: [employer]  Tobacco Use   Smoking status: Every Day   Smokeless tobacco: Never   Tobacco comments:    30+ pack years, has cut back to 1 ppd  Vaping Use   Vaping  Use: Never used  Substance and Sexual Activity   Alcohol use: Yes   Drug use: No   Sexual activity: Never    Partners: Female    Birth control/protection: None  Other Topics Concern   Not on file  Social History Narrative   Not on file   Social Determinants of Health   Financial Resource Strain: Not on file  Food Insecurity: Not on file  Transportation Needs: Not on file  Physical Activity: Inactive (05/30/2017)   Exercise Vital Sign    Days of Exercise per Week: 0 days    Minutes of Exercise per Session: 0 min  Stress: Not on file  Social Connections: Not on file    Family History  Problem Relation Age of Onset   Liver cancer Mother    Heart attack Father    High blood pressure Father     Health Maintenance  Topic Date Due   Pneumonia Vaccine 88+ Years old (1 - PCV) Never done   COVID-19 Vaccine (4 - Pfizer series) 03/26/2020   INFLUENZA VACCINE  07/10/2022 (Originally 11/09/2021)   COLONOSCOPY (Pts 45-81yr Insurance coverage will need to be confirmed)  02/20/2029   TETANUS/TDAP  05/23/2029   Hepatitis C Screening  Completed   HIV Screening  Completed   Zoster Vaccines- Shingrix  Completed   HPV VACCINES  Aged Out     ----------------------------------------------------------------------------------------------------------------------------------------------------------------------------------------------------------------- Physical Exam  BP (!) 141/88 (BP Location: Left Arm, Patient Position: Sitting, Cuff Size: Normal)   Pulse 74   Temp 98.8 F (37.1 C) (Oral)   Ht '5\' 9"'$  (1.753 m)   Wt 217 lb 0.6 oz (98.4 kg)   SpO2 97%   BMI 32.05 kg/m   Physical Exam Constitutional:      General: He is not in acute distress. HENT:     Head: Normocephalic and atraumatic.     Right Ear: Tympanic membrane and external ear normal.     Left Ear: Tympanic membrane and external ear normal.  Eyes:     General: No scleral icterus. Neck:     Thyroid: No thyromegaly.   Cardiovascular:     Rate and Rhythm: Normal rate and regular rhythm.     Heart sounds: Normal heart sounds.  Pulmonary:     Effort: Pulmonary effort is normal.     Breath sounds: Normal breath sounds.  Abdominal:     General: Bowel sounds are normal. There is no distension.     Palpations: Abdomen is soft.     Tenderness: There is no abdominal tenderness. There is no guarding.  Musculoskeletal:     Cervical back: Normal range of motion.  Lymphadenopathy:     Cervical: No cervical adenopathy.  Skin:    General: Skin is warm and dry.     Findings: No rash.  Neurological:     Mental Status: He is alert and oriented to person, place, and time.     Cranial Nerves: No cranial nerve deficit.     Motor: No abnormal muscle tone.  Psychiatric:        Mood and Affect: Mood normal.        Behavior: Behavior normal.     ------------------------------------------------------------------------------------------------------------------------------------------------------------------------------------------------------------------- Assessment and Plan  Well adult exam Well adult Orders Placed This Encounter  Procedures   [place] [facility] MEDICARE SCREENING    Standing Status:   Future    Standing Expiration Date:   12/02/2022    Order Specific Question:   Reason for Exam (SYMPTOM  OR DIAGNOSIS REQUIRED)    Answer:   Smoker, AAA screening    Order Specific Question:   Preferred imaging location?    Answer:   [facility]   CT CHEST LUNG CA SCREEN LOW DOSE W/O CM    Standing Status:   Future    Standing Expiration Date:   12/02/2022    Order Specific Question:   Preferred Imaging Location?    Answer:   [facility]   Pneumococcal conjugate vaccine 20-valent (Prevnar 20)   COMPLETE METABOLIC PANEL WITH GFR   CBC with Differential   Lipid Panel w/reflex Direct LDL   PSA  Screenings:  Per lab orders-Low dose CT ordered, AAA screening ordered Immunizations:  Prevnar  20 Anticipatory guidance/Risk factor Reduction:  Recommend smoking cessation.  Additional recommendations per AVS.     No orders of the defined types were placed in this encounter.   No follow-ups on file.    This visit occurred during the SARS-CoV-2 public health emergency.  Safety protocols were in place, including screening questions prior to the visit, additional usage of staff PPE, and extensive cleaning of exam room while observing appropriate contact time as indicated for disinfecting solutions.

## 2021-12-02 ENCOUNTER — Other Ambulatory Visit: Payer: Self-pay | Admitting: Osteopathic Medicine

## 2021-12-02 ENCOUNTER — Other Ambulatory Visit: Payer: Self-pay

## 2021-12-02 LAB — CBC WITH DIFFERENTIAL/PLATELET
Absolute Monocytes: 570 cells/uL (ref 200–950)
Basophils Absolute: 68 cells/uL (ref 0–200)
Basophils Relative: 0.8 %
Eosinophils Absolute: 187 cells/uL (ref 15–500)
Eosinophils Relative: 2.2 %
HCT: 53 % — ABNORMAL HIGH (ref 38.5–50.0)
Hemoglobin: 17.9 g/dL — ABNORMAL HIGH (ref 13.2–17.1)
Lymphs Abs: 2015 cells/uL (ref 850–3900)
MCH: 27.8 pg (ref 27.0–33.0)
MCHC: 33.8 g/dL (ref 32.0–36.0)
MCV: 82.3 fL (ref 80.0–100.0)
MPV: 10.3 fL (ref 7.5–12.5)
Monocytes Relative: 6.7 %
Neutro Abs: 5661 cells/uL (ref 1500–7800)
Neutrophils Relative %: 66.6 %
Platelets: 198 10*3/uL (ref 140–400)
RBC: 6.44 10*6/uL — ABNORMAL HIGH (ref 4.20–5.80)
RDW: 13.7 % (ref 11.0–15.0)
Total Lymphocyte: 23.7 %
WBC: 8.5 10*3/uL (ref 3.8–10.8)

## 2021-12-02 LAB — LIPID PANEL W/REFLEX DIRECT LDL
Cholesterol: 146 mg/dL (ref <200)
HDL: 64 mg/dL (ref >=40)
LDL Cholesterol (Calc): 61 mg/dL (calc)
Non-HDL Cholesterol (Calc): 82 mg/dL (calc) (ref <130)
Total CHOL/HDL Ratio: 2.3 (calc) (ref <5.0)
Triglycerides: 128 mg/dL (ref <150)

## 2021-12-02 LAB — COMPLETE METABOLIC PANEL WITH GFR
AG Ratio: 1.5 (calc) (ref 1.0–2.5)
ALT: 19 U/L (ref 9–46)
AST: 18 U/L (ref 10–35)
Albumin: 4.4 g/dL (ref 3.6–5.1)
Alkaline phosphatase (APISO): 100 U/L (ref 35–144)
BUN: 11 mg/dL (ref 7–25)
CO2: 25 mmol/L (ref 20–32)
Calcium: 9.3 mg/dL (ref 8.6–10.3)
Chloride: 104 mmol/L (ref 98–110)
Creat: 0.87 mg/dL (ref 0.70–1.35)
Globulin: 3 g/dL (calc) (ref 1.9–3.7)
Glucose, Bld: 88 mg/dL (ref 65–139)
Potassium: 4 mmol/L (ref 3.5–5.3)
Sodium: 138 mmol/L (ref 135–146)
Total Bilirubin: 0.7 mg/dL (ref 0.2–1.2)
Total Protein: 7.4 g/dL (ref 6.1–8.1)
eGFR: 96 mL/min/{1.73_m2} (ref >=60)

## 2021-12-02 LAB — PSA: PSA: 1.05 ng/mL (ref <=4.00)

## 2021-12-02 MED ORDER — AMLODIPINE-ATORVASTATIN 10-20 MG PO TABS: 1.0000 | ORAL_TABLET | Freq: Every day | ORAL | 3 refills | Status: DC

## 2021-12-02 MED ORDER — HYDROCHLOROTHIAZIDE 25 MG PO TABS: 25.0000 mg | ORAL_TABLET | Freq: Every day | ORAL | 3 refills | Status: DC

## 2021-12-02 MED ORDER — CETIRIZINE HCL 10 MG PO TABS: 10.0000 mg | ORAL_TABLET | Freq: Every day | ORAL | 3 refills | Status: DC

## 2021-12-22 ENCOUNTER — Ambulatory Visit (INDEPENDENT_AMBULATORY_CARE_PROVIDER_SITE_OTHER): Payer: Medicare Other

## 2021-12-22 DIAGNOSIS — Z136 Encounter for screening for cardiovascular disorders: Secondary | ICD-10-CM

## 2021-12-22 DIAGNOSIS — F17210 Nicotine dependence, cigarettes, uncomplicated: Secondary | ICD-10-CM

## 2021-12-22 DIAGNOSIS — Z122 Encounter for screening for malignant neoplasm of respiratory organs: Secondary | ICD-10-CM

## 2022-06-08 ENCOUNTER — Ambulatory Visit (INDEPENDENT_AMBULATORY_CARE_PROVIDER_SITE_OTHER): Payer: Medicare Other | Admitting: Family Medicine

## 2022-06-08 ENCOUNTER — Encounter: Payer: Self-pay | Admitting: Family Medicine

## 2022-06-08 VITALS — BP 132/78 | HR 81 | Ht 69.0 in | Wt 215.0 lb

## 2022-06-08 DIAGNOSIS — I10 Essential (primary) hypertension: Secondary | ICD-10-CM

## 2022-06-08 DIAGNOSIS — L209 Atopic dermatitis, unspecified: Secondary | ICD-10-CM

## 2022-06-08 DIAGNOSIS — I700 Atherosclerosis of aorta: Secondary | ICD-10-CM

## 2022-06-08 DIAGNOSIS — E785 Hyperlipidemia, unspecified: Secondary | ICD-10-CM

## 2022-06-08 DIAGNOSIS — Z720 Tobacco use: Secondary | ICD-10-CM

## 2022-06-08 MED ORDER — TRIAMCINOLONE ACETONIDE 0.1 % EX CREA: 1.0000 | TOPICAL_CREAM | Freq: Two times a day (BID) | CUTANEOUS | 0 refills | Status: DC

## 2022-06-08 MED ORDER — BUPROPION HCL ER (XL) 150 MG PO TB24: 150.0000 mg | ORAL_TABLET | Freq: Every day | ORAL | 1 refills | Status: DC

## 2022-06-08 NOTE — Assessment & Plan Note (Signed)
BP is well controlled with current medications.  Recommend continuation of current medications. F/u in 6 months.

## 2022-06-08 NOTE — Assessment & Plan Note (Signed)
Recommend adding daily eucerin with triamcinolone as needed to legs.

## 2022-06-08 NOTE — Patient Instructions (Addendum)
Let's try bupropion (wellbutrin)  to help with quitting smoking.  You can use nicotine replacement such as gum or patches as well to help with cutting back.    Try triamcinolone cream on the legs.  Use this in addition to Cerave.   Continue all other medications.   I would recommend seeing a dentist.   See me again in 6 months.

## 2022-06-08 NOTE — Assessment & Plan Note (Signed)
Well controlled with atorvastatin.

## 2022-06-08 NOTE — Assessment & Plan Note (Signed)
Counseled on smoking cessation.  Lipids are well controlled.

## 2022-06-08 NOTE — Assessment & Plan Note (Addendum)
Counseled extensively on quitting.  Interested in trying bupropion.  Starting bupropion'150mg'$  XL

## 2022-06-08 NOTE — Progress Notes (Signed)
[name] - 67 y.o. male MRN [id number]  Date of birth: 09-15-1955  Subjective Chief Complaint  Patient presents with   Hypertension    HPI James Alexander is a 67 y.o. male here today for follow up visit.   History of HTN and COPD.  Reports that he is doing well at this time.    Continues on amlodipine and hctz for management of HTN.  Doing well with this and denies side effects.  He has not had new symptoms related to HTN including chest pain, shortness of breath, palpitations, headache or vision changes.  He does have mild polycythemia likely related to smoking.   He has history of emphysema noted on previous imaging.  He has not had any significant symptoms and is not on any inhalers at this time.  He has cut back some but does continue to smoke.   Aortic atherosclerosis noted on imaging study for lung cancer screening.  Lipids are well controlled. Tolerating atorvastatin well.    Itchy rash on bilateral legs.   ROS:  A comprehensive ROS was completed and negative except as noted per HPI  Allergies  Allergen Reactions   Penicillins Shortness Of Breath and Swelling    Past Medical History:  Diagnosis Date   High blood pressure     History reviewed. No pertinent surgical history.  Social History   Socioeconomic History   Marital status: Married    Spouse name: Decker Nitchman   Number of children: 2   Years of education: Not on file   Highest education level: Not on file  Occupational History   Occupation: [employer]    Employer: [employer]  Tobacco Use   Smoking status: Every Day   Smokeless tobacco: Never   Tobacco comments:    30+ pack years, has cut back to 1 ppd  Vaping Use   Vaping Use: Never used  Substance and Sexual Activity   Alcohol use: Yes   Drug use: No   Sexual activity: Never    Partners: Female    Birth control/protection: None  Other Topics Concern   Not on file  Social History Narrative   Not on file   Social Determinants of Health    Financial Resource Strain: Not on file  Food Insecurity: Not on file  Transportation Needs: Not on file  Physical Activity: Inactive (05/30/2017)   Exercise Vital Sign    Days of Exercise per Week: 0 days    Minutes of Exercise per Session: 0 min  Stress: Not on file  Social Connections: Not on file    Family History  Problem Relation Age of Onset   Liver cancer Mother    Heart attack Father    High blood pressure Father     Health Maintenance  Topic Date Due   INFLUENZA VACCINE  07/10/2022 (Originally 11/09/2021)   Medicare Annual Wellness (AWV)  10/10/2022 (Originally 12-29-1955)   COVID-19 Vaccine (4 - 2023-24 season) 06/24/2023 (Originally 12/10/2021)   Lung Cancer Screening  12/23/2022   COLONOSCOPY (Pts 45-2yr Insurance coverage will need to be confirmed)  02/20/2029   DTaP/Tdap/Td (2 - Td or Tdap) 05/23/2029   Pneumonia Vaccine 67 Years old  Completed   Hepatitis C Screening  Completed   HIV Screening  Completed   Zoster Vaccines- Shingrix  Completed   HPV VACCINES  Aged Out     ----------------------------------------------------------------------------------------------------------------------------------------------------------------------------------------------------------------- Physical Exam BP 132/78 (BP Location: Left Arm, Patient Position: Sitting, Cuff Size: Large)   Pulse 81  Ht '5\' 9"'$  (1.753 m)   Wt 215 lb (97.5 kg)   SpO2 96%   BMI 31.75 kg/m   Physical Exam Constitutional:      Appearance: Normal appearance.  HENT:     Head: Normocephalic and atraumatic.  Eyes:     General: No scleral icterus. Cardiovascular:     Rate and Rhythm: Normal rate and regular rhythm.  Pulmonary:     Effort: Pulmonary effort is normal.     Breath sounds: Normal breath sounds.  Skin:    Comments: Dry, scaly, excoriated rash on bilateral legs.   Neurological:     Mental Status: He is alert.  Psychiatric:        Mood and Affect: Mood normal.        Behavior:  Behavior normal.     ------------------------------------------------------------------------------------------------------------------------------------------------------------------------------------------------------------------- Assessment and Plan  Hypertension BP is well controlled with current medications.  Recommend continuation of current medications. F/u in 6 months.   Aortic atherosclerosis ([place]) Counseled on smoking cessation.  Lipids are well controlled.   Tobacco use Counseled extensively on quitting.  Interested in trying bupropion.  Starting bupropion'150mg'$  XL   HLD (hyperlipidemia) Well controlled with atorvastatin.   Atopic dermatitis Recommend adding daily eucerin with triamcinolone as needed to legs.    Meds ordered this encounter  Medications   buPROPion (WELLBUTRIN XL) 150 MG 24 hr tablet    Sig: Take 1 tablet (150 mg total) by mouth daily.    Dispense:  90 tablet    Refill:  1   triamcinolone cream (KENALOG) 0.1 %    Sig: Apply 1 Application topically 2 (two) times daily.    Dispense:  80 g    Refill:  0    Return in about 6 months (around 12/07/2022) for HTN/Fasting labs.    This visit occurred during the SARS-CoV-2 public health emergency.  Safety protocols were in place, including screening questions prior to the visit, additional usage of staff PPE, and extensive cleaning of exam room while observing appropriate contact time as indicated for disinfecting solutions.

## 2022-06-20 ENCOUNTER — Telehealth: Payer: Self-pay | Admitting: Family Medicine

## 2022-06-20 NOTE — Telephone Encounter (Signed)
Called patient to schedule Medicare Annual Wellness Visit (AWV). Left message for patient to call back and schedule Medicare Annual Wellness Visit (AWV).  Last date of AWV: Never  Please schedule an appointment at any time with Nurse Health Advisor or PCP.  If any questions, please contact me at [phone].  Thank you ,  Lin Givens Patient Access Advocate II Direct Dial: [phone]

## 2022-11-19 ENCOUNTER — Other Ambulatory Visit: Payer: Self-pay | Admitting: Family Medicine

## 2022-12-07 ENCOUNTER — Encounter: Payer: Self-pay | Admitting: Family Medicine

## 2022-12-07 ENCOUNTER — Ambulatory Visit (INDEPENDENT_AMBULATORY_CARE_PROVIDER_SITE_OTHER): Payer: Medicare Other | Admitting: Family Medicine

## 2022-12-07 VITALS — BP 115/70 | HR 78 | Ht 69.0 in | Wt 216.0 lb

## 2022-12-07 DIAGNOSIS — I10 Essential (primary) hypertension: Secondary | ICD-10-CM

## 2022-12-07 DIAGNOSIS — E785 Hyperlipidemia, unspecified: Secondary | ICD-10-CM

## 2022-12-07 DIAGNOSIS — Z720 Tobacco use: Secondary | ICD-10-CM

## 2022-12-07 DIAGNOSIS — Z125 Encounter for screening for malignant neoplasm of prostate: Secondary | ICD-10-CM

## 2022-12-07 DIAGNOSIS — N289 Disorder of kidney and ureter, unspecified: Secondary | ICD-10-CM

## 2022-12-07 NOTE — Progress Notes (Signed)
James Alexander - 67 y.o. male MRN [id number]  Date of birth: [birth year]  Subjective No chief complaint on file.   HPI James Alexander is a 67 year old male here today for follow-up visit.    Continues on combination of Caduet and hydrochlorothiazide.  He is tolerating medications well.  Blood pressure is well-controlled at this time.  No side effects from statin contained within Caduet.  He continues to have difficulty with smoking cessation.  He did not find bupropion to be helpful.  He does not think he would do well with Chantix.  He denies chest pain, shortness of breath, palpitations, headaches or vision changes.  ROS:  A comprehensive ROS was completed and negative except as noted per HPI  Allergies  Allergen Reactions   Penicillins Shortness Of Breath and Swelling    Past Medical History:  Diagnosis Date   High blood pressure     History reviewed. No pertinent surgical history.  Social History   Socioeconomic History   Marital status: Married    Spouse name: Alphonsa Neet   Number of children: 2   Years of education: Not on file   Highest education level: Not on file  Occupational History   Occupation: TRUCK DRIVER    Employer: [employer]  Tobacco Use   Smoking status: Every Day   Smokeless tobacco: Never   Tobacco comments:    30+ pack years, has cut back to 1 ppd  Vaping Use   Vaping status: Never Used  Substance and Sexual Activity   Alcohol use: Yes   Drug use: No   Sexual activity: Never    Partners: Female    Birth control/protection: None  Other Topics Concern   Not on file  Social History Narrative   Not on file   Social Determinants of Health   Financial Resource Strain: Not on file  Food Insecurity: Not on file  Transportation Needs: Not on file  Physical Activity: Inactive (05/30/2017)   Exercise Vital Sign    Days of Exercise per Week: 0 days    Minutes of Exercise per Session: 0 min  Stress: Not on file  Social Connections: Unknown  (08/17/2021)   Received from [facility], [facility]   Social Network    Social Network: Not on file    Family History  Problem Relation Age of Onset   Liver cancer Mother    Heart attack Father    High blood pressure Father     Health Maintenance  Topic Date Due   Medicare Annual Wellness (AWV)  01/07/2023 (Originally 20-Jul-1955)   COVID-19 Vaccine (4 - 2023-24 season) 06/24/2023 (Originally 12/10/2021)   INFLUENZA VACCINE  07/10/2023 (Originally 11/10/2022)   Lung Cancer Screening  12/07/2023 (Originally 12/23/2022)   Colonoscopy  02/20/2029   DTaP/Tdap/Td (2 - Td or Tdap) 05/23/2029   Pneumonia Vaccine 56+ Years old  Completed   Hepatitis C Screening  Completed   Zoster Vaccines- Shingrix  Completed   HPV VACCINES  Aged Out     ----------------------------------------------------------------------------------------------------------------------------------------------------------------------------------------------------------------- Physical Exam BP 115/70 (BP Location: Left Arm, Patient Position: Sitting, Cuff Size: Normal)   Pulse 78   Ht 5\' 9"  (1.753 m)   Wt 216 lb (98 kg)   SpO2 96%   BMI 31.90 kg/m   Physical Exam Constitutional:      Appearance: Normal appearance.  Eyes:     General: No scleral icterus. Cardiovascular:     Rate and Rhythm: Normal rate and regular rhythm.  Pulmonary:  Effort: Pulmonary effort is normal.     Breath sounds: Normal breath sounds.  Musculoskeletal:     Cervical back: Neck supple.  Neurological:     Mental Status: He is alert.  Psychiatric:        Mood and Affect: Mood normal.        Behavior: Behavior normal.     ------------------------------------------------------------------------------------------------------------------------------------------------------------------------------------------------------------------- Assessment and Plan  Hypertension Blood pressure is well-controlled.  Recommend continuation of  current medications for management of hypertension.  HLD (hyperlipidemia) Well controlled with atorvastatin.  Updated lipid panel ordered.  Renal insufficiency Recheck renal function.  Tobacco use Counseled extensively on quitting.  I did not have a whole lot of improvement with bupropion.  He will continue to try to cut back.   No orders of the defined types were placed in this encounter.   Return in about 6 months (around 06/09/2023) for F/u HTN.    This visit occurred during the SARS-CoV-2 public health emergency.  Safety protocols were in place, including screening questions prior to the visit, additional usage of staff PPE, and extensive cleaning of exam room while observing appropriate contact time as indicated for disinfecting solutions.

## 2022-12-07 NOTE — Assessment & Plan Note (Signed)
Well controlled with atorvastatin.  Updated lipid panel ordered.

## 2022-12-07 NOTE — Assessment & Plan Note (Signed)
Counseled extensively on quitting.  I did not have a whole lot of improvement with bupropion.  He will continue to try to cut back.

## 2022-12-07 NOTE — Assessment & Plan Note (Signed)
Recheck renal function. ?

## 2022-12-07 NOTE — Assessment & Plan Note (Signed)
Blood pressure is well-controlled.  Recommend continuation of current medications for management of hypertension.

## 2022-12-08 DIAGNOSIS — E785 Hyperlipidemia, unspecified: Secondary | ICD-10-CM | POA: Diagnosis not present

## 2022-12-08 DIAGNOSIS — I10 Essential (primary) hypertension: Secondary | ICD-10-CM | POA: Diagnosis not present

## 2022-12-09 LAB — CMP14+EGFR
ALT: 14 IU/L (ref 0–44)
AST: 15 IU/L (ref 0–40)
Albumin: 4.4 g/dL (ref 3.9–4.9)
Alkaline Phosphatase: 115 IU/L (ref 44–121)
BUN/Creatinine Ratio: 15 (ref 10–24)
BUN: 14 mg/dL (ref 8–27)
Bilirubin Total: 0.6 mg/dL (ref 0.0–1.2)
CO2: 23 mmol/L (ref 20–29)
Calcium: 9.5 mg/dL (ref 8.6–10.2)
Chloride: 102 mmol/L (ref 96–106)
Creatinine, Ser: 0.94 mg/dL (ref 0.76–1.27)
Globulin, Total: 2.4 g/dL (ref 1.5–4.5)
Glucose: 89 mg/dL (ref 70–99)
Potassium: 4.4 mmol/L (ref 3.5–5.2)
Sodium: 140 mmol/L (ref 134–144)
Total Protein: 6.8 g/dL (ref 6.0–8.5)
eGFR: 89 mL/min/{1.73_m2} (ref >59)

## 2022-12-09 LAB — CBC WITH DIFFERENTIAL/PLATELET
Basophils Absolute: 0.1 10*3/uL (ref 0.0–0.2)
Basos: 1 %
EOS (ABSOLUTE): 0.3 10*3/uL (ref 0.0–0.4)
Eos: 3 %
Hematocrit: 53.2 % — ABNORMAL HIGH (ref 37.5–51.0)
Hemoglobin: 17.6 g/dL (ref 13.0–17.7)
Immature Grans (Abs): 0 10*3/uL (ref 0.0–0.1)
Immature Granulocytes: 0 %
Lymphocytes Absolute: 2.2 10*3/uL (ref 0.7–3.1)
Lymphs: 27 %
MCH: 27.5 pg (ref 26.6–33.0)
MCHC: 33.1 g/dL (ref 31.5–35.7)
MCV: 83 fL (ref 79–97)
Monocytes Absolute: 0.6 10*3/uL (ref 0.1–0.9)
Monocytes: 7 %
Neutrophils Absolute: 4.9 10*3/uL (ref 1.4–7.0)
Neutrophils: 62 %
Platelets: 185 10*3/uL (ref 150–450)
RBC: 6.4 x10E6/uL — ABNORMAL HIGH (ref 4.14–5.80)
RDW: 12.8 % (ref 11.6–15.4)
WBC: 8.1 10*3/uL (ref 3.4–10.8)

## 2022-12-09 LAB — LIPID PANEL WITH LDL/HDL RATIO
Cholesterol, Total: 157 mg/dL (ref 100–199)
HDL: 64 mg/dL (ref >39)
LDL Chol Calc (NIH): 77 mg/dL (ref 0–99)
LDL/HDL Ratio: 1.2 ratio (ref 0.0–3.6)
Triglycerides: 85 mg/dL (ref 0–149)
VLDL Cholesterol Cal: 16 mg/dL (ref 5–40)

## 2022-12-09 LAB — PSA: Prostate Specific Ag, Serum: 0.7 ng/mL (ref 0.0–4.0)

## 2023-02-02 ENCOUNTER — Telehealth: Payer: Self-pay | Admitting: Family Medicine

## 2023-02-02 NOTE — Telephone Encounter (Signed)
Patient called requesting to speak with Panya or Dr. Ashley Royalty at [phone]. Please advise.

## 2023-02-09 ENCOUNTER — Telehealth: Payer: Self-pay | Admitting: Family Medicine

## 2023-02-09 NOTE — Telephone Encounter (Signed)
Patient called he is requesting a call back  and a letter for long term/short term disability   Please adviise  [phone]

## 2023-02-09 NOTE — Telephone Encounter (Signed)
Please get more details.  I'm not sure what exactly he is needing regarding disability as I have not written him out for anything recently

## 2023-02-09 NOTE — Telephone Encounter (Signed)
Patient is requesting the Medical Assistant call him back he has questions about paperwork [phone]

## 2023-02-13 NOTE — Telephone Encounter (Signed)
Returned patient's call. Letter in chart.

## 2023-02-15 ENCOUNTER — Other Ambulatory Visit: Payer: Self-pay | Admitting: Family Medicine

## 2023-02-17 ENCOUNTER — Other Ambulatory Visit: Payer: Self-pay | Admitting: Family Medicine

## 2023-04-02 IMAGING — NM NM HEPATO W/GB/PHARM/[PERSON_NAME]
2 series · 12 of 12 positions shown · non-contrast
Comparison: None.

CLINICAL DATA: Postprandial right upper quadrant pain

EXAM:
NUCLEAR MEDICINE HEPATOBILIARY IMAGING WITH GALLBLADDER EF
TECHNIQUE: Sequential images of the abdomen were obtained [DATE] minutes
following intravenous administration of radiopharmaceutical. After
oral ingestion of Ensure, gallbladder ejection fraction was
determined. At 60 min, normal ejection fraction is greater than 33%.
RADIOPHARMACEUTICALS:  5.43 mCi Oc-22m  Choletec IV

[Series 1: biliary · 4.14mm/px · 6 of 60 frames shown]
[frame 6/60]
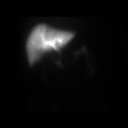
[frame 16/60]
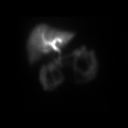
[frame 26/60]
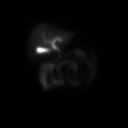
[frame 36/60]
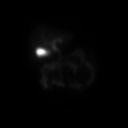
[frame 46/60]
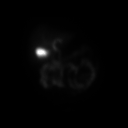
[frame 56/60]
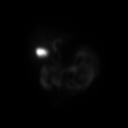

[Series 2: gbef · 4.14mm/px · 6 of 60 frames shown]
[frame 6/60]
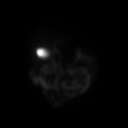
[frame 16/60]
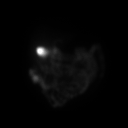
[frame 26/60]
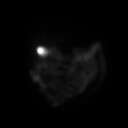
[frame 36/60]
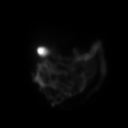
[frame 46/60]
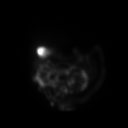
[frame 56/60]
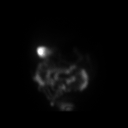

[12 of 12 positions shown; findings below may reference images not displayed]

FINDINGS: Prompt uptake and biliary excretion of activity by the liver is
seen. Gallbladder activity is visualized, consistent with patency of
cystic duct. Biliary activity passes into small bowel, consistent
with patent common bile duct.

Calculated gallbladder ejection fraction is 36%. (Normal gallbladder
ejection fraction with Ensure is greater than 33%.)
IMPRESSION: 1. Normal hepatobiliary scan.  Normal ejection fraction.

## 2023-05-19 ENCOUNTER — Other Ambulatory Visit: Payer: Self-pay | Admitting: Family Medicine

## 2023-05-24 ENCOUNTER — Other Ambulatory Visit: Payer: Self-pay | Admitting: Family Medicine

## 2023-06-07 ENCOUNTER — Encounter: Payer: Self-pay | Admitting: Family Medicine

## 2023-06-07 ENCOUNTER — Ambulatory Visit (INDEPENDENT_AMBULATORY_CARE_PROVIDER_SITE_OTHER): Payer: Medicare Other | Admitting: Family Medicine

## 2023-06-07 VITALS — BP 133/74 | HR 65 | Ht 69.0 in | Wt 215.0 lb

## 2023-06-07 DIAGNOSIS — F17200 Nicotine dependence, unspecified, uncomplicated: Secondary | ICD-10-CM | POA: Insufficient documentation

## 2023-06-07 DIAGNOSIS — Z720 Tobacco use: Secondary | ICD-10-CM

## 2023-06-07 DIAGNOSIS — R61 Generalized hyperhidrosis: Secondary | ICD-10-CM | POA: Insufficient documentation

## 2023-06-07 DIAGNOSIS — Z122 Encounter for screening for malignant neoplasm of respiratory organs: Secondary | ICD-10-CM | POA: Diagnosis not present

## 2023-06-07 DIAGNOSIS — I10 Essential (primary) hypertension: Secondary | ICD-10-CM | POA: Diagnosis not present

## 2023-06-07 DIAGNOSIS — E785 Hyperlipidemia, unspecified: Secondary | ICD-10-CM | POA: Diagnosis not present

## 2023-06-07 MED ORDER — AMLODIPINE-ATORVASTATIN 10-20 MG PO TABS: 1.0000 | ORAL_TABLET | Freq: Every day | ORAL | 3 refills | Status: AC

## 2023-06-07 MED ORDER — CETIRIZINE HCL 10 MG PO TABS: 10.0000 mg | ORAL_TABLET | Freq: Every day | ORAL | 3 refills | Status: AC

## 2023-06-07 MED ORDER — HYDROCHLOROTHIAZIDE 25 MG PO TABS: 25.0000 mg | ORAL_TABLET | Freq: Every day | ORAL | 3 refills | Status: AC

## 2023-06-07 MED ORDER — BUPROPION HCL ER (XL) 150 MG PO TB24: 150.0000 mg | ORAL_TABLET | Freq: Every day | ORAL | 3 refills | Status: AC

## 2023-06-07 NOTE — Assessment & Plan Note (Signed)
 No other red flag symptoms.  Checking labs today.  Orders Placed This Encounter  Procedures   CMP14+EGFR   CBC with Differential/Platelet   TSH   Sed Rate (ESR)   C-reactive protein   Ambulatory Referral Lung Cancer Screening [facility]    Referral Priority:   Routine    Referral Type:   Consultation    Referral Reason:   Specialty Services Required    Number of Visits Requested:   1

## 2023-06-07 NOTE — Assessment & Plan Note (Signed)
 Blood pressure is well-controlled.  Recommend continuation of current medications for management of hypertension.

## 2023-06-07 NOTE — Progress Notes (Signed)
 James Alexander - 68 y.o. male MRN [id number]  Date of birth: 13-May-1955  Subjective Chief Complaint  Patient presents with   Medical Management of Chronic Issues    HPI James Alexander is a 68 y.o. male here today for follow up visit.   He reports that he is doing pretty well at this time.  [name] Kitchen    He continues on caduet and hydrochlorothiazide.  He reports that he is doing well with this.  He denies side effects of medication at current strength.  He has not had chest pain, shortness of breath, palpitations, headaches or vision changes.  He does continue to smoke about 2 packs/day.  Bupropion has not really been effective at helping him cut back on smoking.  He was report having some night sweats over the past few months.  No significant weight loss.  Denies any fevers.    ROS:  A comprehensive ROS was completed and negative except as noted per HPI    Allergies  Allergen Reactions   Penicillins Shortness Of Breath and Swelling    Past Medical History:  Diagnosis Date   High blood pressure     History reviewed. No pertinent surgical history.  Social History   Socioeconomic History   Marital status: Married    Spouse name: James Alexander   Number of children: 2   Years of education: Not on file   Highest education level: Not on file  Occupational History   Occupation: TRUCK DRIVER    Employer: [employer]  Tobacco Use   Smoking status: Every Day   Smokeless tobacco: Never   Tobacco comments:    30+ pack years, has cut back to 1 ppd  Vaping Use   Vaping status: Never Used  Substance and Sexual Activity   Alcohol use: Yes   Drug use: No   Sexual activity: Never    Partners: Female    Birth control/protection: None  Other Topics Concern   Not on file  Social History Narrative   Not on file   Social Drivers of Health   Financial Resource Strain: Not on file  Food Insecurity: Not on file  Transportation Needs: Not on file  Physical Activity: Inactive (05/30/2017)    Exercise Vital Sign    Days of Exercise per Week: 0 days    Minutes of Exercise per Session: 0 min  Stress: Not on file  Social Connections: Unknown (08/17/2021)   Received from [facility], [facility]   Social Network    Social Network: Not on file    Family History  Problem Relation Age of Onset   Liver cancer Mother    Heart attack Father    High blood pressure Father     Health Maintenance  Topic Date Due   Medicare Annual Wellness (AWV)  Never done   INFLUENZA VACCINE  07/10/2023 (Originally 11/10/2022)   Lung Cancer Screening  12/07/2023 (Originally 12/23/2022)   COVID-19 Vaccine (4 - 2024-25 season) 06/22/2024 (Originally 12/11/2022)   Colonoscopy  02/20/2029   DTaP/Tdap/Td (2 - Td or Tdap) 05/23/2029   Pneumonia Vaccine 84+ Years old  Completed   Hepatitis C Screening  Completed   Zoster Vaccines- Shingrix  Completed   HPV VACCINES  Aged Out     ----------------------------------------------------------------------------------------------------------------------------------------------------------------------------------------------------------------- Physical Exam BP 133/74 (BP Location: Left Arm, Patient Position: Sitting, Cuff Size: Normal)   Pulse 65   Ht 5\' 9"  (1.753 m)   Wt 215 lb (97.5 kg)   SpO2 97%  BMI 31.75 kg/m   Physical Exam Constitutional:      Appearance: Normal appearance.  HENT:     Head: Normocephalic and atraumatic.  Eyes:     General: No scleral icterus. Cardiovascular:     Rate and Rhythm: Normal rate and regular rhythm.  Pulmonary:     Effort: Pulmonary effort is normal.     Breath sounds: Normal breath sounds.  Musculoskeletal:     Cervical back: Neck supple.  Neurological:     General: No focal deficit present.     Mental Status: He is alert.  Psychiatric:        Mood and Affect: Mood normal.        Behavior: Behavior normal.      ------------------------------------------------------------------------------------------------------------------------------------------------------------------------------------------------------------------- Assessment and Plan  Hypertension Blood pressure is well-controlled.  Recommend continuation of current medications for management of hypertension.  Encounter for screening for malignant neoplasm of lung in current smoker with 30 pack year history or greater Referral placed for lung cancer screening.  Tobacco use Counseled on smoking cessation.  Night sweats No other red flag symptoms.  Checking labs today.  Orders Placed This Encounter  Procedures   CMP14+EGFR   CBC with Differential/Platelet   TSH   Sed Rate (ESR)   C-reactive protein   Ambulatory Referral Lung Cancer Screening [facility]    Referral Priority:   Routine    Referral Type:   Consultation    Referral Reason:   Specialty Services Required    Number of Visits Requested:   1     Meds ordered this encounter  Medications   amlodipine-atorvastatin (CADUET) 10-20 MG tablet    Sig: Take 1 tablet by mouth daily.    Dispense:  90 tablet    Refill:  3   cetirizine (ZYRTEC) 10 MG tablet    Sig: Take 1 tablet (10 mg total) by mouth daily.    Dispense:  100 tablet    Refill:  3   hydrochlorothiazide (HYDRODIURIL) 25 MG tablet    Sig: Take 1 tablet (25 mg total) by mouth daily.    Dispense:  90 tablet    Refill:  3   buPROPion (WELLBUTRIN XL) 150 MG 24 hr tablet    Sig: Take 1 tablet (150 mg total) by mouth daily.    Dispense:  90 tablet    Refill:  3    Return in about 6 months (around 12/05/2023) for Hypertension.    This visit occurred during the SARS-CoV-2 public health emergency.  Safety protocols were in place, including screening questions prior to the visit, additional usage of staff PPE, and extensive cleaning of exam room while observing appropriate contact time as indicated for  disinfecting solutions.

## 2023-06-07 NOTE — Assessment & Plan Note (Signed)
Referral placed for lung cancer screening.  

## 2023-06-07 NOTE — Assessment & Plan Note (Signed)
 Counseled on smoking cessation

## 2023-06-08 LAB — CMP14+EGFR
ALT: 24 [IU]/L (ref 0–44)
AST: 21 [IU]/L (ref 0–40)
Albumin: 4.4 g/dL (ref 3.9–4.9)
Alkaline Phosphatase: 120 [IU]/L (ref 44–121)
BUN/Creatinine Ratio: 15 (ref 10–24)
BUN: 13 mg/dL (ref 8–27)
Bilirubin Total: 0.5 mg/dL (ref 0.0–1.2)
CO2: 22 mmol/L (ref 20–29)
Calcium: 9.3 mg/dL (ref 8.6–10.2)
Chloride: 103 mmol/L (ref 96–106)
Creatinine, Ser: 0.85 mg/dL (ref 0.76–1.27)
Globulin, Total: 2.5 g/dL (ref 1.5–4.5)
Glucose: 94 mg/dL (ref 70–99)
Potassium: 4.1 mmol/L (ref 3.5–5.2)
Sodium: 140 mmol/L (ref 134–144)
Total Protein: 6.9 g/dL (ref 6.0–8.5)
eGFR: 96 mL/min/{1.73_m2} (ref >59)

## 2023-06-08 LAB — CBC WITH DIFFERENTIAL/PLATELET
Basophils Absolute: 0.1 10*3/uL (ref 0.0–0.2)
Basos: 1 %
EOS (ABSOLUTE): 0.5 10*3/uL — ABNORMAL HIGH (ref 0.0–0.4)
Eos: 6 %
Hematocrit: 50 % (ref 37.5–51.0)
Hemoglobin: 16.9 g/dL (ref 13.0–17.7)
Immature Grans (Abs): 0 10*3/uL (ref 0.0–0.1)
Immature Granulocytes: 0 %
Lymphocytes Absolute: 2.3 10*3/uL (ref 0.7–3.1)
Lymphs: 28 %
MCH: 28.1 pg (ref 26.6–33.0)
MCHC: 33.8 g/dL (ref 31.5–35.7)
MCV: 83 fL (ref 79–97)
Monocytes Absolute: 0.6 10*3/uL (ref 0.1–0.9)
Monocytes: 7 %
Neutrophils Absolute: 4.8 10*3/uL (ref 1.4–7.0)
Neutrophils: 58 %
Platelets: 183 10*3/uL (ref 150–450)
RBC: 6.02 x10E6/uL — ABNORMAL HIGH (ref 4.14–5.80)
RDW: 13.7 % (ref 11.6–15.4)
WBC: 8.3 10*3/uL (ref 3.4–10.8)

## 2023-06-08 LAB — C-REACTIVE PROTEIN: CRP: 3 mg/L (ref 0–10)

## 2023-06-08 LAB — SEDIMENTATION RATE: Sed Rate: 18 mm/h (ref 0–30)

## 2023-06-08 LAB — TSH: TSH: 1.75 u[IU]/mL (ref 0.450–4.500)

## 2023-06-13 ENCOUNTER — Other Ambulatory Visit: Payer: Self-pay | Admitting: *Deleted

## 2023-06-13 DIAGNOSIS — Z87891 Personal history of nicotine dependence: Secondary | ICD-10-CM

## 2023-06-13 DIAGNOSIS — F17210 Nicotine dependence, cigarettes, uncomplicated: Secondary | ICD-10-CM

## 2023-06-13 DIAGNOSIS — Z122 Encounter for screening for malignant neoplasm of respiratory organs: Secondary | ICD-10-CM

## 2023-06-21 ENCOUNTER — Ambulatory Visit

## 2023-06-21 DIAGNOSIS — F17210 Nicotine dependence, cigarettes, uncomplicated: Secondary | ICD-10-CM

## 2023-06-21 DIAGNOSIS — Z122 Encounter for screening for malignant neoplasm of respiratory organs: Secondary | ICD-10-CM | POA: Diagnosis not present

## 2023-06-21 DIAGNOSIS — Z87891 Personal history of nicotine dependence: Secondary | ICD-10-CM

## 2023-07-17 ENCOUNTER — Other Ambulatory Visit: Payer: Self-pay

## 2023-07-17 DIAGNOSIS — F17210 Nicotine dependence, cigarettes, uncomplicated: Secondary | ICD-10-CM

## 2023-07-17 DIAGNOSIS — Z87891 Personal history of nicotine dependence: Secondary | ICD-10-CM

## 2023-07-17 DIAGNOSIS — Z122 Encounter for screening for malignant neoplasm of respiratory organs: Secondary | ICD-10-CM

## 2023-08-16 ENCOUNTER — Ambulatory Visit: Payer: Medicare Other

## 2023-08-16 ENCOUNTER — Telehealth: Payer: Self-pay

## 2023-08-16 VITALS — BP 129/74 | HR 76 | Ht 70.0 in | Wt 214.0 lb

## 2023-08-16 DIAGNOSIS — Z0000 Encounter for general adult medical examination without abnormal findings: Secondary | ICD-10-CM | POA: Diagnosis not present

## 2023-08-16 NOTE — Telephone Encounter (Signed)
 Trejuan wanted to know if you would take his friend as a new patient.   [employer].

## 2023-08-16 NOTE — Patient Instructions (Signed)
  James Alexander , Thank you for taking time to come for your Medicare Wellness Visit. I appreciate your ongoing commitment to your health goals. Please review the following plan we discussed and let me know if I can assist you in the future.   These are the goals we discussed:  Goals      Patient Stated     Patient would like to quit smoking.         This is a list of the screening recommended for you and due dates:  Health Maintenance  Topic Date Due   COVID-19 Vaccine (4 - 2024-25 season) 06/22/2024*   Flu Shot  11/10/2023   Screening for Lung Cancer  06/20/2024   Medicare Annual Wellness Visit  08/15/2024   Colon Cancer Screening  02/20/2029   DTaP/Tdap/Td vaccine (2 - Td or Tdap) 05/23/2029   Pneumonia Vaccine  Completed   Hepatitis C Screening  Completed   Zoster (Shingles) Vaccine  Completed   HPV Vaccine  Aged Out   Meningitis B Vaccine  Aged Out  *Topic was postponed. The date shown is not the original due date.

## 2023-08-16 NOTE — Progress Notes (Signed)
 Subjective:   James Alexander is a 68 y.o. male who presents for Medicare Annual/Subsequent preventive examination.  Visit Complete: In person  Patient Medicare AWV questionnaire was completed by the patient on n/a; I have confirmed that all information answered by patient is correct and no changes since this date.  Cardiac Risk Factors include: advanced age (>13men, >45 women);male gender;dyslipidemia;hypertension;obesity (BMI >30kg/m2);smoking/ tobacco exposure;family history of premature cardiovascular disease     Objective:    Today's Vitals   08/16/23 0853 08/16/23 0918  BP: (!) 142/83 129/74  Pulse: 76   SpO2: 96%   Weight: 214 lb (97.1 kg)   Height: 5\' 10"  (1.778 m)    Body mass index is 30.71 kg/m.     08/16/2023    9:07 AM  Advanced Directives  Does Patient Have a Medical Advance Directive? Yes  Type of Estate agent of [place];Living will  Does patient want to make changes to medical advance directive? No - Patient declined  Copy of Healthcare Power of Attorney in Chart? No - copy requested    Current Medications (verified) Outpatient Encounter Medications as of 08/16/2023  Medication Sig   acetaminophen (TYLENOL) 325 MG tablet Take 650 mg by mouth every 6 (six) hours as needed.   amlodipine -atorvastatin  (CADUET ) 10-20 MG tablet Take 1 tablet by mouth daily.   buPROPion  (WELLBUTRIN  XL) 150 MG 24 hr tablet Take 1 tablet (150 mg total) by mouth daily.   cetirizine  (ZYRTEC ) 10 MG tablet Take 1 tablet (10 mg total) by mouth daily.   hydrochlorothiazide  (HYDRODIURIL ) 25 MG tablet Take 1 tablet (25 mg total) by mouth daily.   No facility-administered encounter medications on file as of 08/16/2023.    Allergies (verified) Penicillins   History: Past Medical History:  Diagnosis Date   High blood pressure    Hyperlipidemia    History reviewed. No pertinent surgical history. Family History  Problem Relation Age of Onset   Liver cancer Mother     Heart attack Father    High blood pressure Father    Social History   Socioeconomic History   Marital status: Married    Spouse name: Cub Giannini   Number of children: 2   Years of education: Not on file   Highest education level: Not on file  Occupational History   Occupation: TRUCK DRIVER    Employer: [employer]  Tobacco Use   Smoking status: Every Day    Current packs/day: 1.00    Average packs/day: 1 pack/day for 54.7 years (54.7 ttl pk-yrs)    Types: Cigarettes    Start date: 12/10/1968   Smokeless tobacco: Never   Tobacco comments:    30+ pack years, has cut back to 1 ppd  Vaping Use   Vaping status: Never Used  Substance and Sexual Activity   Alcohol use: Yes   Drug use: No   Sexual activity: Never    Partners: Female    Birth control/protection: None  Other Topics Concern   Not on file  Social History Narrative   He lives with his wife. He loves camping and working on cars.    Social Drivers of Corporate investment banker Strain: Low Risk  (08/16/2023)   Overall Financial Resource Strain (CARDIA)    Difficulty of Paying Living Expenses: Not hard at all  Food Insecurity: No Food Insecurity (08/16/2023)   Hunger Vital Sign    Worried About Running Out of Food in the Last Year: Never true  Ran Out of Food in the Last Year: Never true  Transportation Needs: No Transportation Needs (08/16/2023)   PRAPARE - Administrator, Civil Service (Medical): No    Lack of Transportation (Non-Medical): No  Physical Activity: Sufficiently Active (08/16/2023)   Exercise Vital Sign    Days of Exercise per Week: 4 days    Minutes of Exercise per Session: 60 min  Stress: No Stress Concern Present (08/16/2023)   [employer] of Occupational Health - Occupational Stress Questionnaire    Feeling of Stress : Not at all  Social Connections: Moderately Integrated (08/16/2023)   Social Connection and Isolation Panel [NHANES]    Frequency of Communication with Friends and  Family: More than three times a week    Frequency of Social Gatherings with Friends and Family: More than three times a week    Attends Religious Services: Never    Database administrator or Organizations: Yes    Attends Engineer, structural: More than 4 times per year    Marital Status: Married    Tobacco Counseling Ready to quit: Not Answered Counseling given: Not Answered Tobacco comments: 30+ pack years, has cut back to 1 ppd   Clinical Intake:  Pre-visit preparation completed: Yes  Pain : No/denies pain     BMI - recorded: 30.71 Nutritional Status: BMI > 30  Obese Nutritional Risks: None Diabetes: No  How often do you need to have someone help you when you read instructions, pamphlets, or other written materials from your doctor or pharmacy?: 1 - Never What is the last grade level you completed in school?: 12  Interpreter Needed?: No      Activities of Daily Living    08/16/2023    8:56 AM  In your present state of health, do you have any difficulty performing the following activities:  Hearing? 1  Vision? 0  Difficulty concentrating or making decisions? 0  Walking or climbing stairs? 0  Dressing or bathing? 0  Doing errands, shopping? 0  Preparing Food and eating ? N  Using the Toilet? N  In the past six months, have you accidently leaked urine? N  Do you have problems with loss of bowel control? N  Managing your Medications? N  Managing your Finances? N  Housekeeping or managing your Housekeeping? N    Patient Care Team: Adela Holter, DO as PCP - General (Family Medicine)  Indicate any recent Medical Services you may have received from other than [facility] providers in the past year (date may be approximate).     Assessment:   This is a routine wellness examination for Larenz.  Hearing/Vision screen No results found.   Goals Addressed             This Visit's Progress    Patient Stated       Patient would like to quit smoking.        Depression Screen    08/16/2023    9:06 AM 06/07/2023    8:49 AM 06/08/2022    8:13 AM 12/01/2021    8:23 AM 06/03/2021    8:09 AM 04/15/2021    3:05 PM 11/26/2020    8:16 AM  PHQ 2/9 Scores  PHQ - 2 Score 0 0 0 0 0 0 1    Fall Risk    08/16/2023    9:08 AM 06/07/2023    8:49 AM 06/08/2022    8:13 AM 12/01/2021    8:23 AM 06/03/2021  8:08 AM  Fall Risk   Falls in the past year? 0 0 0 0 0  Number falls in past yr: 0 0 0 0 0  Injury with Fall? 0 0 0 0 0  Risk for fall due to : No Fall Risks No Fall Risks No Fall Risks No Fall Risks No Fall Risks  Follow up Falls evaluation completed Falls evaluation completed Falls evaluation completed Falls evaluation completed Falls evaluation completed    MEDICARE RISK AT HOME: Medicare Risk at Home Any stairs in or around the home?: Yes If so, are there any without handrails?: No Home free of loose throw rugs in walkways, pet beds, electrical cords, etc?: Yes Adequate lighting in your home to reduce risk of falls?: Yes Life alert?: No Use of a cane, walker or w/c?: No Grab bars in the bathroom?: No Shower chair or bench in shower?: No Elevated toilet seat or a handicapped toilet?: No  TIMED UP AND GO:  Was the test performed?  Yes  Length of time to ambulate 10 feet: 8 sec Gait steady and fast without use of assistive device    Cognitive Function:        08/16/2023    9:09 AM  6CIT Screen  What Year? 0 points  What month? 0 points  What time? 0 points  Count back from 20 0 points  Months in reverse 0 points  Repeat phrase 2 points  Total Score 2 points    Immunizations Immunization History  Administered Date(s) Administered   Fluad Quad(high Dose 65+) 03/11/2021   Influenza Split 01/22/2010   Influenza, Seasonal, Injecte, Preservative Fre 03/12/2014, 03/18/2015   Influenza,inj,Quad PF,6+ Mos 05/30/2017, 05/24/2019   Influenza-Unspecified 01/22/2010, 03/04/2011, 04/20/2012, 02/22/2013, 03/12/2014, 03/18/2015    PFIZER(Purple Top)SARS-COV-2 Vaccination 06/21/2019, 07/17/2019, 01/30/2020   PNEUMOCOCCAL CONJUGATE-20 12/01/2021   Tdap 05/24/2019   Zoster Recombinant(Shingrix ) 02/28/2018, 05/23/2018    TDAP status: Up to date  Flu Vaccine status: Declined, Education has been provided regarding the importance of this vaccine but patient still declined. Advised may receive this vaccine at local pharmacy or Health Dept. Aware to provide a copy of the vaccination record if obtained from local pharmacy or Health Dept. Verbalized acceptance and understanding.  Pneumococcal vaccine status: Up to date  Covid-19 vaccine status: Declined, Education has been provided regarding the importance of this vaccine but patient still declined. Advised may receive this vaccine at local pharmacy or Health Dept.or vaccine clinic. Aware to provide a copy of the vaccination record if obtained from local pharmacy or Health Dept. Verbalized acceptance and understanding.  Qualifies for Shingles Vaccine? Yes   Zostavax completed No   Shingrix  Completed?: Yes  Screening Tests Health Maintenance  Topic Date Due   COVID-19 Vaccine (4 - 2024-25 season) 06/22/2024 (Originally 12/11/2022)   INFLUENZA VACCINE  11/10/2023   Lung Cancer Screening  06/20/2024   Medicare Annual Wellness (AWV)  08/15/2024   Colonoscopy  02/20/2029   DTaP/Tdap/Td (2 - Td or Tdap) 05/23/2029   Pneumonia Vaccine 19+ Years old  Completed   Hepatitis C Screening  Completed   Zoster Vaccines- Shingrix   Completed   HPV VACCINES  Aged Out   Meningococcal B Vaccine  Aged Out    Health Maintenance  There are no preventive care reminders to display for this patient.   Colorectal cancer screening: Type of screening: Colonoscopy. Completed 02/21/2019. Repeat every 10 years  Lung Cancer Screening: (Low Dose CT Chest recommended if Age 64-80 years, 20 pack-year currently smoking OR  have quit w/in 15years.) does qualify.   Lung Cancer Screening Referral:  Patient in program.   Additional Screening:  Hepatitis C Screening: does qualify; Completed 07/19/2016  Vision Screening: Recommended annual ophthalmology exams for early detection of glaucoma and other disorders of the eye. Is the patient up to date with their annual eye exam?  Yes  Who is the provider or what is the name of the office in which the patient attends annual eye exams? [facility] If pt is not established with a provider, would they like to be referred to a provider to establish care?  N/a .   Dental Screening: Recommended annual dental exams for proper oral hygiene   Community Resource Referral / Chronic Care Management: CRR required this visit?  No   CCM required this visit?  No     Plan:     I have personally reviewed and noted the following in the patient's chart:   Medical and social history Use of alcohol, tobacco or illicit drugs  Current medications and supplements including opioid prescriptions. Patient is not currently taking opioid prescriptions. Functional ability and status Nutritional status Physical activity Advanced directives List of other physicians Hospitalizations, surgeries, and ER visits in previous 12 months. None Vitals Screenings to include cognitive, depression, and falls Referrals and appointments  In addition, I have reviewed and discussed with patient certain preventive protocols, quality metrics, and best practice recommendations. A written personalized care plan for preventive services as well as general preventive health recommendations were provided to patient.     Aubrey Leaf, CMA   08/16/2023   After Visit Summary: (In Person-Printed) AVS printed and given to the patient  Nurse Notes:   JACOBIE AKINA is a 68 y.o. y.o. male patient of Adela Holter, MD who had a Medicare Annual Wellness Visit today. He reports that he is socially active and does interact with friends/family regularly. He is moderately physically  active. He enjoys camping.

## 2023-08-17 ENCOUNTER — Telehealth: Payer: Self-pay | Admitting: Family Medicine

## 2023-08-17 NOTE — Telephone Encounter (Signed)
 Left a brief voicemail for patient to return call back to office regarding his inquiry for his friend. Direct call back info provided.

## 2023-08-17 NOTE — Telephone Encounter (Signed)
 Copied from CRM [id number]. Topic: General - Other >> Aug 17, 2023 12:44 PM Brynn Caras wrote: Reason for CRM: The patient is requesting a callback from someone from the office in regards to his inquiry to refer his friend to Dr.Matthews, I advised the lunch hours. The patient is available until 3:00pm today for a callback.  Callback [phone]

## 2023-08-22 ENCOUNTER — Telehealth: Payer: Self-pay

## 2023-08-22 NOTE — Telephone Encounter (Signed)
 Copied from CRM [id number]. Topic: General - Other >> Aug 22, 2023  3:48 PM Lenon Radar A wrote: Reason for CRM: Patient is requesting call from Dr. Augustus Ledger to speak with him regarding friend he referred to the clinic. Please contact patient to discuss further. Patient can be reached at [phone].

## 2023-08-23 NOTE — Telephone Encounter (Signed)
 Spoke with James Alexander regarding his friend "junior" who is scheduled with DR. Matthews for 09/06/23. He just wanted to make Dr. Augustus Ledger aware that he had not had much healthcare in past ( only a few visits to [facility]) - he asked that Dr. Augustus Ledger take his time with the evaluation  and really listen to what patient says ( states he has a lot of health concerns) . States patient is getting up in age and does not want anything to happen to him.

## 2023-08-25 NOTE — Telephone Encounter (Signed)
 Patient is scheduled with Dr. Augustus Ledger for 09/06/23

## 2023-11-16 NOTE — Progress Notes (Unsigned)
 Chart review for COL

## 2023-12-06 ENCOUNTER — Ambulatory Visit (INDEPENDENT_AMBULATORY_CARE_PROVIDER_SITE_OTHER): Payer: Medicare Other | Admitting: Family Medicine

## 2023-12-06 ENCOUNTER — Encounter: Payer: Self-pay | Admitting: Family Medicine

## 2023-12-06 VITALS — BP 137/74 | HR 65 | Ht 70.0 in | Wt 208.0 lb

## 2023-12-06 DIAGNOSIS — K7460 Unspecified cirrhosis of liver: Secondary | ICD-10-CM | POA: Diagnosis not present

## 2023-12-06 DIAGNOSIS — I10 Essential (primary) hypertension: Secondary | ICD-10-CM

## 2023-12-06 DIAGNOSIS — E785 Hyperlipidemia, unspecified: Secondary | ICD-10-CM | POA: Diagnosis not present

## 2023-12-06 DIAGNOSIS — Z720 Tobacco use: Secondary | ICD-10-CM | POA: Diagnosis not present

## 2023-12-06 NOTE — Assessment & Plan Note (Signed)
 Blood pressure is well-controlled.  Recommend continuation of current medications for management of hypertension.

## 2023-12-06 NOTE — Assessment & Plan Note (Signed)
 Well controlled with atorvastatin .   Lab Results  Component Value Date   LDLCALC 77 12/08/2022

## 2023-12-06 NOTE — Assessment & Plan Note (Signed)
 Counseled on smoking cessation.  Continue bupropion .

## 2023-12-06 NOTE — Progress Notes (Signed)
 James Alexander - 68 y.o. male MRN [id number]  Date of birth: 03-27-1956  Subjective Chief Complaint  Patient presents with   Hypertension    HPI James Alexander is a 68 y.o. male here today for follow up visit.   BP is well controlled with amlodipine  and hydrochlorothiazide .  He denies side effects from medication at current strength.  He has cut back on smoking from 2ppd to 0.5ppd.   Feels better and has less congestion since cutting back.  Tried patches but developed rash.  He remains on bupropion .   He remains on atorvastatin  for lipid management and reduction in cardiovascular risk.  He is tolerating this well.   Cirrhosis incidentally noted on lung cncer CT.  Denies abdominal pain.  He does continue to consume EtOH.   ROS:  A comprehensive ROS was completed and negative except as noted per HPI  Allergies  Allergen Reactions   Penicillins Shortness Of Breath and Swelling    Past Medical History:  Diagnosis Date   High blood pressure    Hyperlipidemia     No past surgical history on file.  Social History   Socioeconomic History   Marital status: Married    Spouse name: Petr Bontempo   Number of children: 2   Years of education: Not on file   Highest education level: Not on file  Occupational History   Occupation: TRUCK DRIVER    Employer: [employer]  Tobacco Use   Smoking status: Every Day    Current packs/day: 0.50    Average packs/day: 2.0 packs/day for 55.0 years (109.1 ttl pk-yrs)    Types: Cigarettes    Start date: 12/10/1968   Smokeless tobacco: Never   Tobacco comments:    30+ pack years, has cut back to 1 ppd  Vaping Use   Vaping status: Never Used  Substance and Sexual Activity   Alcohol use: Yes   Drug use: No   Sexual activity: Never    Partners: Female    Birth control/protection: None  Other Topics Concern   Not on file  Social History Narrative   He lives with his wife. He loves camping and working on cars.    Social Drivers of Manufacturing engineer Strain: Low Risk  (08/16/2023)   Overall Financial Resource Strain (CARDIA)    Difficulty of Paying Living Expenses: Not hard at all  Food Insecurity: No Food Insecurity (08/16/2023)   Hunger Vital Sign    Worried About Running Out of Food in the Last Year: Never true    Ran Out of Food in the Last Year: Never true  Transportation Needs: No Transportation Needs (08/16/2023)   PRAPARE - Administrator, Civil Service (Medical): No    Lack of Transportation (Non-Medical): No  Physical Activity: Sufficiently Active (08/16/2023)   Exercise Vital Sign    Days of Exercise per Week: 4 days    Minutes of Exercise per Session: 60 min  Stress: No Stress Concern Present (08/16/2023)   [employer] of Occupational Health - Occupational Stress Questionnaire    Feeling of Stress : Not at all  Social Connections: Moderately Integrated (08/16/2023)   Social Connection and Isolation Panel    Frequency of Communication with Friends and Family: More than three times a week    Frequency of Social Gatherings with Friends and Family: More than three times a week    Attends Religious Services: Never    Database administrator or Organizations:  Yes    Attends Club or Organization Meetings: More than 4 times per year    Marital Status: Married    Family History  Problem Relation Age of Onset   Liver cancer Mother    Heart attack Father    High blood pressure Father     Health Maintenance  Topic Date Due   INFLUENZA VACCINE  11/10/2023   COVID-19 Vaccine (4 - 2024-25 season) 06/22/2024 (Originally 12/11/2022)   Lung Cancer Screening  06/20/2024   Medicare Annual Wellness (AWV)  08/15/2024   Colonoscopy  02/20/2029   DTaP/Tdap/Td (2 - Td or Tdap) 05/23/2029   Pneumococcal Vaccine: 50+ Years  Completed   Hepatitis C Screening  Completed   Zoster Vaccines- Shingrix   Completed   HPV VACCINES  Aged Out   Meningococcal B Vaccine  Aged Out      ----------------------------------------------------------------------------------------------------------------------------------------------------------------------------------------------------------------- Physical Exam BP 137/74 (BP Location: Left Arm, Patient Position: Sitting, Cuff Size: Normal)   Pulse 65   Ht 5' 10 (1.778 m)   Wt 208 lb (94.3 kg)   SpO2 97%   BMI 29.84 kg/m   Physical Exam Constitutional:      Appearance: Normal appearance.  Eyes:     General: No scleral icterus. Cardiovascular:     Rate and Rhythm: Normal rate and regular rhythm.  Pulmonary:     Effort: Pulmonary effort is normal.     Breath sounds: Normal breath sounds.  Musculoskeletal:     Cervical back: Neck supple.  Neurological:     General: No focal deficit present.     Mental Status: He is alert.  Psychiatric:        Mood and Affect: Mood normal.        Behavior: Behavior normal.     ------------------------------------------------------------------------------------------------------------------------------------------------------------------------------------------------------------------- Assessment and Plan  Cirrhosis of liver (HCC) Cirrhotic changes noted on CT in 06/2023.  Encouraged to cut back on EtOH use.   Hypertension Blood pressure is well-controlled.  Recommend continuation of current medications for management of hypertension.  Tobacco use Counseled on smoking cessation.  Continue bupropion .    HLD (hyperlipidemia) Well controlled with atorvastatin .   Lab Results  Component Value Date   LDLCALC 77 12/08/2022     No orders of the defined types were placed in this encounter.   Return in about 6 months (around 06/07/2024) for Hypertension.

## 2023-12-06 NOTE — Assessment & Plan Note (Signed)
 Cirrhotic changes noted on CT in 06/2023.  Encouraged to cut back on EtOH use.

## 2024-01-11 ENCOUNTER — Telehealth: Payer: Self-pay

## 2024-01-11 NOTE — Telephone Encounter (Signed)
 Copied from CRM [id number]. Topic: General - Other >> Jan 11, 2024  1:21 PM Sophia H wrote: Reason for CRM: Patient states he was supposed to have a scan done and when clinic reached out for scheduling he was not available. Can not find the voicemail anymore and is needing the information on how to schedule that. Please reach out and assist patient. Ty # [phone]  Patient will go to bed at 4pm for work tonight, please reach out before then. If unable to reach please leave info in voicemail.

## 2024-01-11 NOTE — Telephone Encounter (Signed)
 Spoke with patient  He thought  that he was to have US  of liver ordered by Dr. Alvia.  I did not see this imaging ordered in patient chart.  I did give him contact information for [facility] imaging department.

## 2024-01-12 ENCOUNTER — Other Ambulatory Visit: Payer: Self-pay | Admitting: Family Medicine

## 2024-01-12 DIAGNOSIS — K7460 Unspecified cirrhosis of liver: Secondary | ICD-10-CM

## 2024-01-31 ENCOUNTER — Ambulatory Visit (INDEPENDENT_AMBULATORY_CARE_PROVIDER_SITE_OTHER)

## 2024-01-31 DIAGNOSIS — K7460 Unspecified cirrhosis of liver: Secondary | ICD-10-CM | POA: Diagnosis not present

## 2024-01-31 DIAGNOSIS — K8020 Calculus of gallbladder without cholecystitis without obstruction: Secondary | ICD-10-CM | POA: Diagnosis not present

## 2024-02-09 ENCOUNTER — Ambulatory Visit: Payer: Self-pay | Admitting: Family Medicine

## 2024-02-13 ENCOUNTER — Telehealth: Payer: Self-pay | Admitting: Family Medicine

## 2024-02-13 NOTE — Telephone Encounter (Unsigned)
 Copied from CRM #[id number]. Topic: Clinical - Lab/Test Results >> Feb 13, 2024  3:13 PM Dedra B wrote: Reason for CRM: Pt returning call for [facility] regarding test results. 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

## 2024-02-13 NOTE — Telephone Encounter (Signed)
 Returned your call regarding his test results.

## 2024-02-14 NOTE — Telephone Encounter (Signed)
 Patient informed of results . He is not having any pain around the liver or gallbladder area. Informed that we would  just monitor for now. Patient voiced understanding

## 2024-02-15 ENCOUNTER — Encounter: Payer: Self-pay | Admitting: *Deleted

## 2024-02-15 NOTE — Progress Notes (Unsigned)
 TRU RANA                                          MRN: [id number]   02/15/2024   The [facility] Quality Team Specialist reviewed this patient medical record for the purposes of chart review for care gap closure. The following were reviewed: {CHL AMB [facility] QUALITY SPECIALIST REASON FOR REVIEW:[id number]}.    [facility]I Quality Team

## 2024-06-06 ENCOUNTER — Ambulatory Visit: Admitting: Family Medicine

## 2024-08-21 ENCOUNTER — Ambulatory Visit
